# Patient Record
Sex: Female | Born: 1990 | Race: Black or African American | Hispanic: No | Marital: Single | Smoking: Never smoker
Health system: Southern US, Community
[De-identification: ages and names within clinical notes are randomized; demographics above are authoritative.]

## PROBLEM LIST (undated history)

## (undated) HISTORY — PX: NO PAST SURGERIES: SHX2092

---

## 2016-12-23 ENCOUNTER — Encounter: Payer: Self-pay | Admitting: Certified Nurse Midwife

## 2016-12-30 ENCOUNTER — Encounter: Payer: Self-pay | Admitting: Certified Nurse Midwife

## 2016-12-30 ENCOUNTER — Other Ambulatory Visit: Payer: Self-pay | Admitting: Certified Nurse Midwife

## 2016-12-30 ENCOUNTER — Ambulatory Visit (INDEPENDENT_AMBULATORY_CARE_PROVIDER_SITE_OTHER): Payer: BC Managed Care – PPO | Admitting: Certified Nurse Midwife

## 2016-12-30 VITALS — BP 119/77 | HR 84 | Ht 65.0 in | Wt 169.6 lb

## 2016-12-30 DIAGNOSIS — Z3009 Encounter for other general counseling and advice on contraception: Secondary | ICD-10-CM | POA: Diagnosis not present

## 2016-12-30 DIAGNOSIS — Z113 Encounter for screening for infections with a predominantly sexual mode of transmission: Secondary | ICD-10-CM

## 2016-12-30 DIAGNOSIS — Z6828 Body mass index (BMI) 28.0-28.9, adult: Secondary | ICD-10-CM | POA: Diagnosis not present

## 2016-12-30 DIAGNOSIS — Z01419 Encounter for gynecological examination (general) (routine) without abnormal findings: Secondary | ICD-10-CM | POA: Diagnosis not present

## 2016-12-30 MED ORDER — MISOPROSTOL 200 MCG PO TABS
ORAL_TABLET | ORAL | 2 refills | Status: DC
Start: 2016-12-30 — End: 2017-03-14

## 2016-12-30 NOTE — Progress Notes (Signed)
ANNUAL PREVENTATIVE CARE GYN  ENCOUNTER NOTE  Subjective:       Briana Shepherd is a 26 y.o. G0P0000 female here for a routine annual gynecologic exam.  She would like to discuss other contraception options bedside "the pill".   Denies difficulty breathing or respiratory distress, chest pain, abdominal pain, excessive vaginal bleeding, dysuria, and leg pain or swelling.   Graduated recently from [employer]. Works as Animal nutritionist.    Gynecologic History  Patient's last menstrual period was 11/04/2016 (exact date). Changed pill three (3) months ago.  Contraception: oral progesterone-only contraceptive  Last Pap: 09/2015. Results were: normal  Obstetric History  OB History  Gravida Para Term Preterm AB Living  0 0 0 0 0 0  SAB TAB Ectopic Multiple Live Births  0 0 0 0 0        Current Outpatient Medications on File Prior to Visit  Medication Sig Dispense Refill  . norethindrone-ethinyl estradiol (MICROGESTIN,JUNEL,LOESTRIN) 1-20 MG-MCG tablet Take 1 tablet by mouth daily.     No current facility-administered medications on file prior to visit.     Allergies not on file  Social History   Socioeconomic History  . Marital status: Single    Spouse name: Not on file  . Number of children: Not on file  . Years of education: Not on file  . Highest education level: Not on file  Social Needs  . Financial resource strain: Not on file  . Food insecurity - worry: Not on file  . Food insecurity - inability: Not on file  . Transportation needs - medical: Not on file  . Transportation needs - non-medical: Not on file  Occupational History  . Not on file  Tobacco Use  . Smoking status: Never Smoker  . Smokeless tobacco: Never Used  Substance and Sexual Activity  . Alcohol use: Yes    Comment: occas  . Drug use: No  . Sexual activity: Yes    Birth control/protection: Pill  Other Topics Concern  . Not on file  Social History Narrative  . Not on file    Family History   Problem Relation Age of Onset  . Diabetes Maternal Grandmother   . Stroke Maternal Grandmother     The following portions of the patient's history were reviewed and updated as appropriate: allergies, current medications, past family history, past medical history, past social history, past surgical history and problem list.  Review of Systems  ROS negative except as noted above. Information obtained from patient.    Objective:   BP 119/77   Pulse 84   Ht 5\' 5"  (1.651 m)   Wt 169 lb 9 oz (76.9 kg)   LMP 11/04/2016 (Exact Date)   BMI 28.22 kg/m   CONSTITUTIONAL: Well-developed, well-nourished female in no acute distress.   PSYCHIATRIC: Normal mood and affect. Normal behavior. Normal judgment and thought content.  [place]: Alert and oriented to person, place, and time. Normal muscle tone coordination. No cranial nerve deficit noted.  HENT:  Normocephalic, atraumatic, External right and left ear normal. Oropharynx is clear and moist  EYES: Conjunctivae and EOM are normal. Pupils are equal, round, and reactive to light. No scleral icterus.   NECK: Normal range of motion, supple, no masses.  Normal thyroid.   SKIN: Skin is warm and dry. No rash noted. Not diaphoretic. No erythema. No pallor.  CARDIOVASCULAR: Normal heart rate noted, regular rhythm, no murmur.  RESPIRATORY: Clear to auscultation bilaterally. Effort and breath sounds normal, no problems with respiration  noted.  BREASTS: Symmetric in size. No masses, skin changes, nipple drainage, or lymphadenopathy.  ABDOMEN: Soft, normal bowel sounds, no distention noted.  No tenderness, rebound or guarding.   PELVIC:  External Genitalia: Normal  Vagina: Normal  Cervix: Normal  Uterus: Normal  Adnexa: Normal   MUSCULOSKELETAL: Normal range of motion. No tenderness.  No cyanosis, clubbing, or edema.  2+ distal pulses.  LYMPHATIC: No Axillary, Supraclavicular, or Inguinal Adenopathy.  UPT negative.  Assessment:    Annual gynecologic examination 26 y.o.   Contraception: OCP (estrogen/progesterone); desires IUD placement  Overweight   Problem List Items Addressed This Visit    None    Visit Diagnoses    Well woman exam with routine gynecological exam    -  Primary   Relevant Orders   POCT urine pregnancy   CBC   Comprehensive metabolic panel   Lipid panel   BMI 28.0-28.9,adult       Relevant Orders   Lipid panel   Routine screening for STI (sexually transmitted infection)       Encounter for counseling regarding contraception       Relevant Orders   POCT urine pregnancy      Plan:   Pap: Pap, Reflex if ASCUS and GC/CT NAAT  Labs: CBC, CMP, and Lipid Panel; see orders.   Routine preventative health maintenance measures emphasized: Exercise/Diet/Weight control, Tobacco Warnings, Alcohol/Substance use risks, Stress Management, Peer Pressure Issues and Safe Sex  Reviewed all forms of birth control options available including abstinence; over the counter/barrier methods; hormonal contraceptive medication including pill, patch, ring, injection,contraceptive implant; hormonal and nonhormonal IUDs. Risks and benefits reviewed.  Questions were answered.  Information was given to patient to review.   RTC x 1 year for annual exam or sooner if needed.   RTC x 1-2 weeks for IUD placement, if desired. Rx: Cytotec, see orders.   Diona Fanti, CNM [facility], [facility]

## 2016-12-30 NOTE — Progress Notes (Signed)
Pt is here for a pap smear. No history of abnormals. Pt switched OCP brand 3 months ago and this is the first time she has missed a period.

## 2016-12-30 NOTE — Patient Instructions (Signed)
Ethinyl Estradiol; Etonogestrel vaginal ring What is this medicine? ETHINYL ESTRADIOL; ETONOGESTREL (ETH in il es tra DYE ole; et oh noe JES trel) vaginal ring is a flexible, vaginal ring used as a contraceptive (birth control method). This medicine combines two types of female hormones, an estrogen and a progestin. This ring is used to prevent ovulation and pregnancy. Each ring is effective for one month. This medicine may be used for other purposes; ask your health care provider or pharmacist if you have questions. COMMON BRAND NAME(S): NuvaRing What should I tell my health care provider before I take this medicine? They need to know if you have or ever had any of these conditions: -abnormal vaginal bleeding -blood vessel disease or blood clots -breast, cervical, endometrial, ovarian, liver, or uterine cancer -diabetes -gallbladder disease -heart disease or recent heart attack -high blood pressure -high cholesterol -kidney disease -liver disease -migraine headaches -stroke -systemic lupus erythematosus (SLE) -tobacco smoker -an unusual or allergic reaction to estrogens, progestins, other medicines, foods, dyes, or preservatives -pregnant or trying to get pregnant -breast-feeding How should I use this medicine? Insert the ring into your vagina as directed. Follow the directions on the prescription label. The ring will remain place for 3 weeks and is then removed for a 1-week break. A new ring is inserted 1 week after the last ring was removed, on the same day of the week. Check often to make sure the ring is still in place, especially before and after sexual intercourse. If the ring was out of the vagina for an unknown amount of time, you may not be protected from pregnancy. Perform a pregnancy test and call your doctor. Do not use more often than directed. A patient package insert for the product will be given with each prescription and refill. Read this sheet carefully each time. The  sheet may change frequently. Contact your pediatrician regarding the use of this medicine in children. Special care may be needed. This medicine has been used in female children who have started having menstrual periods. Overdosage: If you think you have taken too much of this medicine contact a poison control center or emergency room at once. NOTE: This medicine is only for you. Do not share this medicine with others. What if I miss a dose? You will need to replace your vaginal ring once a month as directed. If the ring should slip out, or if you leave it in longer or shorter than you should, contact your health care professional for advice. What may interact with this medicine? Do not take this medicine with the following medication: -dasabuvir; ombitasvir; paritaprevir; ritonavir -ombitasvir; paritaprevir; ritonavir This medicine may also interact with the following medications: -acetaminophen -antibiotics or medicines for infections, especially rifampin, rifabutin, rifapentine, and griseofulvin, and possibly penicillins or tetracyclines -aprepitant -ascorbic acid (vitamin C) -atorvastatin -barbiturate medicines, such as phenobarbital -bosentan -carbamazepine -caffeine -clofibrate -cyclosporine -dantrolene -doxercalciferol -felbamate -grapefruit juice -hydrocortisone -medicines for anxiety or sleeping problems, such as diazepam or temazepam -medicines for diabetes, including pioglitazone -modafinil -mycophenolate -nefazodone -oxcarbazepine -phenytoin -prednisolone -ritonavir or other medicines for HIV infection or AIDS -rosuvastatin -selegiline -soy isoflavones supplements -St. John's wort -tamoxifen or raloxifene -theophylline -thyroid hormones -topiramate -warfarin This list may not describe all possible interactions. Give your health care provider a list of all the medicines, herbs, non-prescription drugs, or dietary supplements you use. Also tell them if you smoke,  drink alcohol, or use illegal drugs. Some items may interact with your medicine. What should I watch for while using   this medicine? Visit your doctor or health care professional for regular checks on your progress. You will need a regular breast and pelvic exam and Pap smear while on this medicine. Use an additional method of contraception during the first cycle that you use this ring. Do not use a diaphragm or female condom, as the ring can interfere with these birth control methods and their proper placement. If you have any reason to think you are pregnant, stop using this medicine right away and contact your doctor or health care professional. If you are using this medicine for hormone related problems, it may take several cycles of use to see improvement in your condition. Smoking increases the risk of getting a blood clot or having a stroke while you are using hormonal birth control, especially if you are more than 27 years old. You are strongly advised not to smoke. This medicine can make your body retain fluid, making your fingers, hands, or ankles swell. Your blood pressure can go up. Contact your doctor or health care professional if you feel you are retaining fluid. This medicine can make you more sensitive to the sun. Keep out of the sun. If you cannot avoid being in the sun, wear protective clothing and use sunscreen. Do not use sun lamps or tanning beds/booths. If you wear contact lenses and notice visual changes, or if the lenses begin to feel uncomfortable, consult your eye care specialist. In some women, tenderness, swelling, or minor bleeding of the gums may occur. Notify your dentist if this happens. Brushing and flossing your teeth regularly may help limit this. See your dentist regularly and inform your dentist of the medicines you are taking. If you are going to have elective surgery, you may need to stop using this medicine before the surgery. Consult your health care professional  for advice. This medicine does not protect you against HIV infection (AIDS) or any other sexually transmitted diseases. What side effects may I notice from receiving this medicine? Side effects that you should report to your doctor or health care professional as soon as possible: -breast tissue changes or discharge -changes in vaginal bleeding during your period or between your periods -chest pain -coughing up blood -dizziness or fainting spells -headaches or migraines -leg, arm or groin pain -severe or sudden headaches -stomach pain (severe) -sudden shortness of breath -sudden loss of coordination, especially on one side of the body -speech problems -symptoms of vaginal infection like itching, irritation or unusual discharge -tenderness in the upper abdomen -vomiting -weakness or numbness in the arms or legs, especially on one side of the body -yellowing of the eyes or skin Side effects that usually do not require medical attention (report to your doctor or health care professional if they continue or are bothersome): -breakthrough bleeding and spotting that continues beyond the 3 initial cycles of pills -breast tenderness -mood changes, anxiety, depression, frustration, anger, or emotional outbursts -increased sensitivity to sun or ultraviolet light -nausea -skin rash, acne, or brown spots on the skin -weight gain (slight) This list may not describe all possible side effects. Call your doctor for medical advice about side effects. You may report side effects to FDA at 1-800-FDA-1088. Where should I keep my medicine? Keep out of the reach of children. Store at room temperature between 15 and 30 degrees C (59 and 86 degrees F) for up to 4 months. The product will expire after 4 months. Protect from light. Throw away any unused medicine after the expiration date. NOTE: This   sheet is a summary. It may not cover all possible information. If you have questions about this medicine, talk  to your doctor, pharmacist, or health care provider.  2018 Elsevier/Gold Standard (2015-09-08 17:00:31) Levonorgestrel intrauterine device (IUD) What is this medicine? LEVONORGESTREL IUD (LEE voe nor jes trel) is a contraceptive (birth control) device. The device is placed inside the uterus by a healthcare professional. It is used to prevent pregnancy. This device can also be used to treat heavy bleeding that occurs during your period. This medicine may be used for other purposes; ask your health care provider or pharmacist if you have questions. COMMON BRAND NAME(S): Minette [place] What should I tell my health care provider before I take this medicine? They need to know if you have any of these conditions: -abnormal Pap smear -cancer of the breast, uterus, or cervix -diabetes -endometritis -genital or pelvic infection now or in the past -have more than one sexual partner or your partner has more than one partner -heart disease -history of an ectopic or tubal pregnancy -immune system problems -IUD in place -liver disease or tumor -problems with blood clots or take blood-thinners -seizures -use intravenous drugs -uterus of unusual shape -vaginal bleeding that has not been explained -an unusual or allergic reaction to levonorgestrel, other hormones, silicone, or polyethylene, medicines, foods, dyes, or preservatives -pregnant or trying to get pregnant -breast-feeding How should I use this medicine? This device is placed inside the uterus by a health care professional. Talk to your pediatrician regarding the use of this medicine in children. Special care may be needed. Overdosage: If you think you have taken too much of this medicine contact a poison control center or emergency room at once. NOTE: This medicine is only for you. Do not share this medicine with others. What if I miss a dose? This does not apply. Depending on the brand of device you have inserted, the  device will need to be replaced every 3 to 5 years if you wish to continue using this type of birth control. What may interact with this medicine? Do not take this medicine with any of the following medications: -amprenavir -bosentan -fosamprenavir This medicine may also interact with the following medications: -aprepitant -armodafinil -barbiturate medicines for inducing sleep or treating seizures -bexarotene -boceprevir -griseofulvin -medicines to treat seizures like carbamazepine, ethotoin, felbamate, oxcarbazepine, phenytoin, topiramate -modafinil -pioglitazone -rifabutin -rifampin -rifapentine -some medicines to treat HIV infection like atazanavir, efavirenz, indinavir, lopinavir, nelfinavir, tipranavir, ritonavir -St. John's wort -warfarin This list may not describe all possible interactions. Give your health care provider a list of all the medicines, herbs, non-prescription drugs, or dietary supplements you use. Also tell them if you smoke, drink alcohol, or use illegal drugs. Some items may interact with your medicine. What should I watch for while using this medicine? Visit your doctor or health care professional for regular check ups. See your doctor if you or your partner has sexual contact with others, becomes HIV positive, or gets a sexual transmitted disease. This product does not protect you against HIV infection (AIDS) or other sexually transmitted diseases. You can check the placement of the IUD yourself by reaching up to the top of your vagina with clean fingers to feel the threads. Do not pull on the threads. It is a good habit to check placement after each menstrual period. Call your doctor right away if you feel more of the IUD than just the threads or if you cannot feel the threads at all. The IUD  may come out by itself. You may become pregnant if the device comes out. If you notice that the IUD has come out use a backup birth control method like condoms and call your  health care provider. Using tampons will not change the position of the IUD and are okay to use during your period. This IUD can be safely scanned with magnetic resonance imaging (MRI) only under specific conditions. Before you have an MRI, tell your healthcare provider that you have an IUD in place, and which type of IUD you have in place. What side effects may I notice from receiving this medicine? Side effects that you should report to your doctor or health care professional as soon as possible: -allergic reactions like skin rash, itching or hives, swelling of the face, lips, or tongue -fever, flu-like symptoms -genital sores -high blood pressure -no menstrual period for 6 weeks during use -pain, swelling, warmth in the leg -pelvic pain or tenderness -severe or sudden headache -signs of pregnancy -stomach cramping -sudden shortness of breath -trouble with balance, talking, or walking -unusual vaginal bleeding, discharge -yellowing of the eyes or skin Side effects that usually do not require medical attention (report to your doctor or health care professional if they continue or are bothersome): -acne -breast pain -change in sex drive or performance -changes in weight -cramping, dizziness, or faintness while the device is being inserted -headache -irregular menstrual bleeding within first 3 to 6 months of use -nausea This list may not describe all possible side effects. Call your doctor for medical advice about side effects. You may report side effects to FDA at 1-800-FDA-1088. Where should I keep my medicine? This does not apply. NOTE: This sheet is a summary. It may not cover all possible information. If you have questions about this medicine, talk to your doctor, pharmacist, or health care provider.  2018 Elsevier/Gold Standard (2015-10-13 14:14:56) Preventive Care 18-39 Years, Female Preventive care refers to lifestyle choices and visits with your health care provider that  can promote health and wellness. What does preventive care include?  A yearly physical exam. This is also called an annual well check.  Dental exams once or twice a year.  Routine eye exams. Ask your health care provider how often you should have your eyes checked.  Personal lifestyle choices, including: ? Daily care of your teeth and gums. ? Regular physical activity. ? Eating a healthy diet. ? Avoiding tobacco and drug use. ? Limiting alcohol use. ? Practicing safe sex. ? Taking vitamin and mineral supplements as recommended by your health care provider. What happens during an annual well check? The services and screenings done by your health care provider during your annual well check will depend on your age, overall health, lifestyle risk factors, and family history of disease. Counseling Your health care provider may ask you questions about your:  Alcohol use.  Tobacco use.  Drug use.  Emotional well-being.  Home and relationship well-being.  Sexual activity.  Eating habits.  Work and work Statistician.  Method of birth control.  Menstrual cycle.  Pregnancy history.  Screening You may have the following tests or measurements:  Height, weight, and BMI.  Diabetes screening. This is done by checking your blood sugar (glucose) after you have not eaten for a while (fasting).  Blood pressure.  Lipid and cholesterol levels. These may be checked every 5 years starting at age 79.  Skin check.  Hepatitis C blood test.  Hepatitis B blood test.  Sexually transmitted disease (STD)  testing.  BRCA-related cancer screening. This may be done if you have a family history of breast, ovarian, tubal, or peritoneal cancers.  Pelvic exam and Pap test. This may be done every 3 years starting at age 50. Starting at age 81, this may be done every 5 years if you have a Pap test in combination with an HPV test.  Discuss your test results, treatment options, and if  necessary, the need for more tests with your health care provider. Vaccines Your health care provider may recommend certain vaccines, such as:  Influenza vaccine. This is recommended every year.  Tetanus, diphtheria, and acellular pertussis (Tdap, Td) vaccine. You may need a Td booster every 10 years.  Varicella vaccine. You may need this if you have not been vaccinated.  HPV vaccine. If you are 86 or younger, you may need three doses over 6 months.  Measles, mumps, and rubella (MMR) vaccine. You may need at least one dose of MMR. You may also need a second dose.  Pneumococcal 13-valent conjugate (PCV13) vaccine. You may need this if you have certain conditions and were not previously vaccinated.  Pneumococcal polysaccharide (PPSV23) vaccine. You may need one or two doses if you smoke cigarettes or if you have certain conditions.  Meningococcal vaccine. One dose is recommended if you are age 16-21 years and a first-year college student living in a residence hall, or if you have one of several medical conditions. You may also need additional booster doses.  Hepatitis A vaccine. You may need this if you have certain conditions or if you travel or work in places where you may be exposed to hepatitis A.  Hepatitis B vaccine. You may need this if you have certain conditions or if you travel or work in places where you may be exposed to hepatitis B.  Haemophilus influenzae type b (Hib) vaccine. You may need this if you have certain risk factors.  Talk to your health care provider about which screenings and vaccines you need and how often you need them. This information is not intended to replace advice given to you by your health care provider. Make sure you discuss any questions you have with your health care provider. Document Released: 02/26/2001 Document Revised: 09/20/2015 Document Reviewed: 11/01/2014 Elsevier Interactive Patient Education  2017 [employer].

## 2017-01-03 LAB — PAP LB, RFX HPV ALL PTH: PAP SMEAR COMMENT: 0

## 2017-01-10 ENCOUNTER — Other Ambulatory Visit: Payer: BC Managed Care – PPO

## 2017-01-10 DIAGNOSIS — Z01419 Encounter for gynecological examination (general) (routine) without abnormal findings: Secondary | ICD-10-CM

## 2017-01-10 DIAGNOSIS — Z6828 Body mass index (BMI) 28.0-28.9, adult: Secondary | ICD-10-CM

## 2017-01-11 LAB — CBC
Hematocrit: 40.4 % (ref 34.0–46.6)
Hemoglobin: 12.7 g/dL (ref 11.1–15.9)
MCH: 26.6 pg (ref 26.6–33.0)
MCHC: 31.4 g/dL — AB (ref 31.5–35.7)
MCV: 85 fL (ref 79–97)
PLATELETS: 331 10*3/uL (ref 150–379)
RBC: 4.78 x10E6/uL (ref 3.77–5.28)
RDW: 12.8 % (ref 12.3–15.4)
WBC: 4.5 10*3/uL (ref 3.4–10.8)

## 2017-01-11 LAB — LIPID PANEL
CHOLESTEROL TOTAL: 198 mg/dL (ref 100–199)
Chol/HDL Ratio: 3.4 ratio (ref 0.0–4.4)
HDL: 59 mg/dL (ref >39)
LDL Calculated: 119 mg/dL — ABNORMAL HIGH (ref 0–99)
Triglycerides: 98 mg/dL (ref 0–149)
VLDL CHOLESTEROL CAL: 20 mg/dL (ref 5–40)

## 2017-01-11 LAB — COMPREHENSIVE METABOLIC PANEL
ALBUMIN: 3.9 g/dL (ref 3.5–5.5)
ALK PHOS: 72 IU/L (ref 39–117)
ALT: 13 IU/L (ref 0–32)
AST: 18 IU/L (ref 0–40)
Albumin/Globulin Ratio: 1.1 — ABNORMAL LOW (ref 1.2–2.2)
BUN / CREAT RATIO: 12 (ref 9–23)
BUN: 11 mg/dL (ref 6–20)
Bilirubin Total: 0.4 mg/dL (ref 0.0–1.2)
CO2: 23 mmol/L (ref 20–29)
CREATININE: 0.91 mg/dL (ref 0.57–1.00)
Calcium: 9.5 mg/dL (ref 8.7–10.2)
Chloride: 103 mmol/L (ref 96–106)
GFR, EST AFRICAN AMERICAN: 100 mL/min/{1.73_m2} (ref >59)
GFR, EST NON AFRICAN AMERICAN: 87 mL/min/{1.73_m2} (ref >59)
GLOBULIN, TOTAL: 3.7 g/dL (ref 1.5–4.5)
Glucose: 91 mg/dL (ref 65–99)
Potassium: 4.5 mmol/L (ref 3.5–5.2)
SODIUM: 140 mmol/L (ref 134–144)
Total Protein: 7.6 g/dL (ref 6.0–8.5)

## 2017-01-31 ENCOUNTER — Encounter: Payer: Self-pay | Admitting: Certified Nurse Midwife

## 2017-01-31 ENCOUNTER — Ambulatory Visit (INDEPENDENT_AMBULATORY_CARE_PROVIDER_SITE_OTHER): Payer: BC Managed Care – PPO | Admitting: Certified Nurse Midwife

## 2017-01-31 VITALS — BP 126/68 | HR 84 | Ht 65.0 in | Wt 169.9 lb

## 2017-01-31 DIAGNOSIS — Z30430 Encounter for insertion of intrauterine contraceptive device: Secondary | ICD-10-CM

## 2017-01-31 LAB — POCT URINE PREGNANCY: Preg Test, Ur: NEGATIVE

## 2017-01-31 MED ORDER — LEVONORGESTREL 13.5 MG IU IUD: INTRAUTERINE_SYSTEM | Freq: Once | INTRAUTERINE | Status: DC

## 2017-01-31 NOTE — Patient Instructions (Signed)
IUD PLACEMENT POST-PROCEDURE INSTRUCTIONS  1. You may take Ibuprofen, Aleve or Tylenol for pain if needed.  Cramping should resolve within in 24 hours.  2. You may have a small amount of spotting.  You should wear a mini pad for the next few days.  3. You may have intercourse after 72 hours.  If you using this for birth control, it is effective immediately.  4. You need to call if you have any pelvic pain, fever, heavy bleeding or foul smelling vaginal discharge.  Irregular bleeding is common the first several months after having an IUD placed. You do not need to call for this reason unless you are concerned.  5. Shower or bathe as normal  You should have a follow-up appointment in 4-8 weeks for a re-check to make sure you are not having any problems.  Levonorgestrel intrauterine device (IUD) What is this medicine? LEVONORGESTREL IUD (LEE voe nor jes trel) is a contraceptive (birth control) device. The device is placed inside the uterus by a healthcare professional. It is used to prevent pregnancy. This device can also be used to treat heavy bleeding that occurs during your period. This medicine may be used for other purposes; ask your health care provider or pharmacist if you have questions. COMMON BRAND NAME(S): Minette Headland What should I tell my health care provider before I take this medicine? They need to know if you have any of these conditions: -abnormal Pap smear -cancer of the breast, uterus, or cervix -diabetes -endometritis -genital or pelvic infection now or in the past -have more than one sexual partner or your partner has more than one partner -heart disease -history of an ectopic or tubal pregnancy -immune system problems -IUD in place -liver disease or tumor -problems with blood clots or take blood-thinners -seizures -use intravenous drugs -uterus of unusual shape -vaginal bleeding that has not been explained -an unusual or allergic reaction to  levonorgestrel, other hormones, silicone, or polyethylene, medicines, foods, dyes, or preservatives -pregnant or trying to get pregnant -breast-feeding How should I use this medicine? This device is placed inside the uterus by a health care professional. Talk to your pediatrician regarding the use of this medicine in children. Special care may be needed. Overdosage: If you think you have taken too much of this medicine contact a poison control center or emergency room at once. NOTE: This medicine is only for you. Do not share this medicine with others. What if I miss a dose? This does not apply. Depending on the brand of device you have inserted, the device will need to be replaced every 3 to 5 years if you wish to continue using this type of birth control. What may interact with this medicine? Do not take this medicine with any of the following medications: -amprenavir -bosentan -fosamprenavir This medicine may also interact with the following medications: -aprepitant -armodafinil -barbiturate medicines for inducing sleep or treating seizures -bexarotene -boceprevir -griseofulvin -medicines to treat seizures like carbamazepine, ethotoin, felbamate, oxcarbazepine, phenytoin, topiramate -modafinil -pioglitazone -rifabutin -rifampin -rifapentine -some medicines to treat HIV infection like atazanavir, efavirenz, indinavir, lopinavir, nelfinavir, tipranavir, ritonavir -St. John's wort -warfarin This list may not describe all possible interactions. Give your health care provider a list of all the medicines, herbs, non-prescription drugs, or dietary supplements you use. Also tell them if you smoke, drink alcohol, or use illegal drugs. Some items may interact with your medicine. What should I watch for while using this medicine? Visit your doctor or health care professional  check ups. See your doctor if you or your partner has sexual contact with others, becomes HIV positive, or  gets a sexual transmitted disease. This product does not protect you against HIV infection (AIDS) or other sexually transmitted diseases. You can check the placement of the IUD yourself by reaching up to the top of your vagina with clean fingers to feel the threads. Do not pull on the threads. It is a good habit to check placement after each menstrual period. Call your doctor right away if you feel more of the IUD than just the threads or if you cannot feel the threads at all. The IUD may come out by itself. You may become pregnant if the device comes out. If you notice that the IUD has come out use a backup birth control method like condoms and call your health care provider. Using tampons will not change the position of the IUD and are okay to use during your period. This IUD can be safely scanned with magnetic resonance imaging (MRI) only under specific conditions. Before you have an MRI, tell your healthcare provider that you have an IUD in place, and which type of IUD you have in place. What side effects may I notice from receiving this medicine? Side effects that you should report to your doctor or health care professional as soon as possible: -allergic reactions like skin rash, itching or hives, swelling of the face, lips, or tongue -fever, flu-like symptoms -genital sores -high blood pressure -no menstrual period for 6 weeks during use -pain, swelling, warmth in the leg -pelvic pain or tenderness -severe or sudden headache -signs of pregnancy -stomach cramping -sudden shortness of breath -trouble with balance, talking, or walking -unusual vaginal bleeding, discharge -yellowing of the eyes or skin Side effects that usually do not require medical attention (report to your doctor or health care professional if they continue or are bothersome): -acne -breast pain -change in sex drive or performance -changes in weight -cramping, dizziness, or faintness while the device is being  inserted -headache -irregular menstrual bleeding within first 3 to 6 months of use -nausea This list may not describe all possible side effects. Call your doctor for medical advice about side effects. You may report side effects to FDA at 1-800-FDA-1088. Where should I keep my medicine? This does not apply. NOTE: This sheet is a summary. It may not cover all possible information. If you have questions about this medicine, talk to your doctor, pharmacist, or health care provider.  2018 Elsevier/Gold Standard (2015-10-13 14:14:56)  

## 2017-02-01 NOTE — Progress Notes (Signed)
Briana Shepherd is a 27 y.o. year old [place] female who presents for placement of a Skyla IUD.  Patient's last menstrual period was 01/28/2017. BP 126/68 (BP Location: Left Arm, Patient Position: Sitting, Cuff Size: Normal)   Pulse 84   Ht 5\' 5"  (1.651 m)   Wt 169 lb 14.4 oz (77.1 kg)   LMP 01/28/2017   BMI 28.27 kg/m    She is not sexually active at this time and pregnancy test today was negative.  The risks and benefits of the method and placement have been thouroughly reviewed with the patient and all questions were answered.  Specifically the patient is aware of failure rate of 01/998, expulsion of the IUD and of possible perforation.  The patient is aware of irregular bleeding due to the method and understands the incidence of irregular bleeding diminishes with time.  Signed copy of informed consent in chart.   Time out was performed.  A small plastic speculum was placed in the vagina.  The cervix was visualized, prepped using Betadine, and grasped with a single tooth tenaculum. The uterus was found to be retroflexed and it sounded to 7 cm.  Skyla IUD placed per manufacturer's recommendations.   The strings were trimmed to 3 cm.  The patient was given post procedure instructions, including signs and symptoms of infection and to check for the strings after each menses or each month, and refraining from intercourse or anything in the vagina for 3 days.  She was given a [name] care card with date Skyla placed, and date Skyla to be removed.  Reviewed red flag symptoms and when to call.   RTC x 4-6 weeks for IUD string check or sooner if needed.   Diona Fanti, CNM [facility], [facility]

## 2017-03-14 ENCOUNTER — Ambulatory Visit (INDEPENDENT_AMBULATORY_CARE_PROVIDER_SITE_OTHER): Payer: BC Managed Care – PPO | Admitting: Certified Nurse Midwife

## 2017-03-14 ENCOUNTER — Encounter: Payer: Self-pay | Admitting: Certified Nurse Midwife

## 2017-03-14 VITALS — BP 110/73 | HR 85 | Ht 65.0 in | Wt 171.2 lb

## 2017-03-14 DIAGNOSIS — Z975 Presence of (intrauterine) contraceptive device: Secondary | ICD-10-CM

## 2017-03-14 DIAGNOSIS — Z30431 Encounter for routine checking of intrauterine contraceptive device: Secondary | ICD-10-CM

## 2017-03-14 NOTE — Patient Instructions (Signed)
Preventive Care 18-39 Years, Female Preventive care refers to lifestyle choices and visits with your health care provider that can promote health and wellness. What does preventive care include?  A yearly physical exam. This is also called an annual well check.  Dental exams once or twice a year.  Routine eye exams. Ask your health care provider how often you should have your eyes checked.  Personal lifestyle choices, including: ? Daily care of your teeth and gums. ? Regular physical activity. ? Eating a healthy diet. ? Avoiding tobacco and drug use. ? Limiting alcohol use. ? Practicing safe sex. ? Taking vitamin and mineral supplements as recommended by your health care provider. What happens during an annual well check? The services and screenings done by your health care provider during your annual well check will depend on your age, overall health, lifestyle risk factors, and family history of disease. Counseling Your health care provider may ask you questions about your:  Alcohol use.  Tobacco use.  Drug use.  Emotional well-being.  Home and relationship well-being.  Sexual activity.  Eating habits.  Work and work Statistician.  Method of birth control.  Menstrual cycle.  Pregnancy history.  Screening You may have the following tests or measurements:  Height, weight, and BMI.  Diabetes screening. This is done by checking your blood sugar (glucose) after you have not eaten for a while (fasting).  Blood pressure.  Lipid and cholesterol levels. These may be checked every 5 years starting at age 66.  Skin check.  Hepatitis C blood test.  Hepatitis B blood test.  Sexually transmitted disease (STD) testing.  BRCA-related cancer screening. This may be done if you have a family history of breast, ovarian, tubal, or peritoneal cancers.  Pelvic exam and Pap test. This may be done every 3 years starting at age 40. Starting at age 59, this may be done every 5  years if you have a Pap test in combination with an HPV test.  Discuss your test results, treatment options, and if necessary, the need for more tests with your health care provider. Vaccines Your health care provider may recommend certain vaccines, such as:  Influenza vaccine. This is recommended every year.  Tetanus, diphtheria, and acellular pertussis (Tdap, Td) vaccine. You may need a Td booster every 10 years.  Varicella vaccine. You may need this if you have not been vaccinated.  HPV vaccine. If you are 69 or younger, you may need three doses over 6 months.  Measles, mumps, and rubella (MMR) vaccine. You may need at least one dose of MMR. You may also need a second dose.  Pneumococcal 13-valent conjugate (PCV13) vaccine. You may need this if you have certain conditions and were not previously vaccinated.  Pneumococcal polysaccharide (PPSV23) vaccine. You may need one or two doses if you smoke cigarettes or if you have certain conditions.  Meningococcal vaccine. One dose is recommended if you are age 27-21 years and a first-year college student living in a residence hall, or if you have one of several medical conditions. You may also need additional booster doses.  Hepatitis A vaccine. You may need this if you have certain conditions or if you travel or work in places where you may be exposed to hepatitis A.  Hepatitis B vaccine. You may need this if you have certain conditions or if you travel or work in places where you may be exposed to hepatitis B.  Haemophilus influenzae type b (Hib) vaccine. You may need this if  you have certain risk factors.  Talk to your health care provider about which screenings and vaccines you need and how often you need them. This information is not intended to replace advice given to you by your health care provider. Make sure you discuss any questions you have with your health care provider. Document Released: 02/26/2001 Document Revised: 09/20/2015  Document Reviewed: 11/01/2014 Elsevier Interactive Patient Education  2018 Elsevier Inc. Levonorgestrel intrauterine device (IUD) What is this medicine? LEVONORGESTREL IUD (LEE voe nor jes trel) is a contraceptive (birth control) device. The device is placed inside the uterus by a healthcare professional. It is used to prevent pregnancy. This device can also be used to treat heavy bleeding that occurs during your period. This medicine may be used for other purposes; ask your health care provider or pharmacist if you have questions. COMMON BRAND NAME(S): Kyleena, LILETTA, Mirena, Skyla What should I tell my health care provider before I take this medicine? They need to know if you have any of these conditions: -abnormal Pap smear -cancer of the breast, uterus, or cervix -diabetes -endometritis -genital or pelvic infection now or in the past -have more than one sexual partner or your partner has more than one partner -heart disease -history of an ectopic or tubal pregnancy -immune system problems -IUD in place -liver disease or tumor -problems with blood clots or take blood-thinners -seizures -use intravenous drugs -uterus of unusual shape -vaginal bleeding that has not been explained -an unusual or allergic reaction to levonorgestrel, other hormones, silicone, or polyethylene, medicines, foods, dyes, or preservatives -pregnant or trying to get pregnant -breast-feeding How should I use this medicine? This device is placed inside the uterus by a health care professional. Talk to your pediatrician regarding the use of this medicine in children. Special care may be needed. Overdosage: If you think you have taken too much of this medicine contact a poison control center or emergency room at once. NOTE: This medicine is only for you. Do not share this medicine with others. What if I miss a dose? This does not apply. Depending on the brand of device you have inserted, the device will need  to be replaced every 3 to 5 years if you wish to continue using this type of birth control. What may interact with this medicine? Do not take this medicine with any of the following medications: -amprenavir -bosentan -fosamprenavir This medicine may also interact with the following medications: -aprepitant -armodafinil -barbiturate medicines for inducing sleep or treating seizures -bexarotene -boceprevir -griseofulvin -medicines to treat seizures like carbamazepine, ethotoin, felbamate, oxcarbazepine, phenytoin, topiramate -modafinil -pioglitazone -rifabutin -rifampin -rifapentine -some medicines to treat HIV infection like atazanavir, efavirenz, indinavir, lopinavir, nelfinavir, tipranavir, ritonavir -St. John's wort -warfarin This list may not describe all possible interactions. Give your health care provider a list of all the medicines, herbs, non-prescription drugs, or dietary supplements you use. Also tell them if you smoke, drink alcohol, or use illegal drugs. Some items may interact with your medicine. What should I watch for while using this medicine? Visit your doctor or health care professional for regular check ups. See your doctor if you or your partner has sexual contact with others, becomes HIV positive, or gets a sexual transmitted disease. This product does not protect you against HIV infection (AIDS) or other sexually transmitted diseases. You can check the placement of the IUD yourself by reaching up to the top of your vagina with clean fingers to feel the threads. Do not pull on the threads. It   is a good habit to check placement after each menstrual period. Call your doctor right away if you feel more of the IUD than just the threads or if you cannot feel the threads at all. The IUD may come out by itself. You may become pregnant if the device comes out. If you notice that the IUD has come out use a backup birth control method like condoms and call your health care  provider. Using tampons will not change the position of the IUD and are okay to use during your period. This IUD can be safely scanned with magnetic resonance imaging (MRI) only under specific conditions. Before you have an MRI, tell your healthcare provider that you have an IUD in place, and which type of IUD you have in place. What side effects may I notice from receiving this medicine? Side effects that you should report to your doctor or health care professional as soon as possible: -allergic reactions like skin rash, itching or hives, swelling of the face, lips, or tongue -fever, flu-like symptoms -genital sores -high blood pressure -no menstrual period for 6 weeks during use -pain, swelling, warmth in the leg -pelvic pain or tenderness -severe or sudden headache -signs of pregnancy -stomach cramping -sudden shortness of breath -trouble with balance, talking, or walking -unusual vaginal bleeding, discharge -yellowing of the eyes or skin Side effects that usually do not require medical attention (report to your doctor or health care professional if they continue or are bothersome): -acne -breast pain -change in sex drive or performance -changes in weight -cramping, dizziness, or faintness while the device is being inserted -headache -irregular menstrual bleeding within first 3 to 6 months of use -nausea This list may not describe all possible side effects. Call your doctor for medical advice about side effects. You may report side effects to FDA at 1-800-FDA-1088. Where should I keep my medicine? This does not apply. NOTE: This sheet is a summary. It may not cover all possible information. If you have questions about this medicine, talk to your doctor, pharmacist, or health care provider.  2018 Elsevier/Gold Standard (2015-10-13 14:14:56)

## 2017-03-14 NOTE — Progress Notes (Signed)
Pt is doing well.

## 2017-03-18 DIAGNOSIS — Z975 Presence of (intrauterine) contraceptive device: Secondary | ICD-10-CM | POA: Insufficient documentation

## 2017-03-18 NOTE — Progress Notes (Signed)
GYNECOLOGY OFFICE PROGRESS NOTE  History:  27 y.o. G0P0000 here today for today for IUD string check; Skyla IUD was placed  01/31/2017. No complaintsthe IUD, no concerning side effects.  Denies difficulty breathing or respiratory distress, chest pain, abdominal pain, excessive vaginal bleeding, dysuria, and leg pain or swelling.   The following portions of the patient's history were reviewed and updated as appropriate: allergies, current medications, past family history, past medical history, past social history, past surgical history and problem list. Last pap smear on 12/30/2016 was normal.  Review of Systems:   Pertinent items are noted in HPI.  Objective:   Blood pressure 110/73, pulse 85, height 5\' 5"  (1.651 m), weight 171 lb 3.2 oz (77.7 kg), last menstrual period 03/03/2017.  Physical Exam  CONSTITUTIONAL: Well-developed, well-nourished female in no acute distress.   ABDOMEN: Soft, no distention noted.    PELVIC: Normal appearing external genitalia; normal appearing vaginal mucosa and cervix.  IUD strings visualized  Assessment & Plan:   Normal IUD check.  Patient to keep IUD in place for five years; can come in for removal if she desires pregnancy within the next three (3) years.  Routine preventative health maintenance measures emphasized.   Diona Fanti, CNM [facility], [facility]

## 2017-05-15 ENCOUNTER — Ambulatory Visit (INDEPENDENT_AMBULATORY_CARE_PROVIDER_SITE_OTHER): Payer: BC Managed Care – PPO | Admitting: Certified Nurse Midwife

## 2017-05-15 VITALS — BP 113/70 | HR 76 | Ht 65.0 in | Wt 171.4 lb

## 2017-05-15 DIAGNOSIS — Z202 Contact with and (suspected) exposure to infections with a predominantly sexual mode of transmission: Secondary | ICD-10-CM | POA: Diagnosis not present

## 2017-05-15 DIAGNOSIS — N898 Other specified noninflammatory disorders of vagina: Secondary | ICD-10-CM

## 2017-05-15 MED ORDER — METRONIDAZOLE 500 MG PO TABS: 500.0000 mg | ORAL_TABLET | Freq: Two times a day (BID) | ORAL | 0 refills | Status: AC

## 2017-05-15 NOTE — Progress Notes (Signed)
GYN ENCOUNTER NOTE  Subjective:       Briana Shepherd is a 27 y.o. G0P0000 female is here for gynecologic evaluation of the following issues:  1. Vaginal discharge 2. Vaginal odor 3. Possible STD exposure  Reports decreased symptoms since last visit, but odor persists requiring frequent cleansing episodes throughout the day.   Recently, entered into a relationship and desires STI screening.   Denies difficulty breathing or respiratory distress, chest pain, abdominal pain, excessive vaginal bleeding, dysuria, and leg pain or swelling.    Gynecologic History  Patient's last menstrual period was 04/27/2017 (exact date).  Contraception: IUD, [name]  Last Pap: 12/2016. Results were: normal  Obstetric History  OB History  Gravida Para Term Preterm AB Living  0 0 0 0 0 0  SAB TAB Ectopic Multiple Live Births  0 0 0 0 0    No past medical history on file.  No past surgical history on file.  Current Outpatient Medications on File Prior to Visit  Medication Sig Dispense Refill  . Levonorgestrel (SKYLA) 13.5 MG IUD by Intrauterine route.     No current facility-administered medications on file prior to visit.     No Known Allergies  Social History   Socioeconomic History  . Marital status: Single    Spouse name: Not on file  . Number of children: Not on file  . Years of education: Not on file  . Highest education level: Not on file  Occupational History  . Not on file  Social Needs  . Financial resource strain: Not on file  . Food insecurity:    Worry: Not on file    Inability: Not on file  . Transportation needs:    Medical: Not on file    Non-medical: Not on file  Tobacco Use  . Smoking status: Never Smoker  . Smokeless tobacco: Never Used  Substance and Sexual Activity  . Alcohol use: Yes    Comment: occas  . Drug use: No  . Sexual activity: Yes    Birth control/protection: Pill  Lifestyle  . Physical activity:    Days per week: Not on file   Minutes per session: Not on file  . Stress: Not on file  Relationships  . Social connections:    Talks on phone: Not on file    Gets together: Not on file    Attends religious service: Not on file    Active member of club or organization: Not on file    Attends meetings of clubs or organizations: Not on file    Relationship status: Not on file  . Intimate partner violence:    Fear of current or ex partner: Not on file    Emotionally abused: Not on file    Physically abused: Not on file    Forced sexual activity: Not on file  Other Topics Concern  . Not on file  Social History Narrative  . Not on file    Family History  Problem Relation Age of Onset  . Diabetes Maternal Grandmother   . Stroke Maternal Grandmother     The following portions of the patient's history were reviewed and updated as appropriate: allergies, current medications, past family history, past medical history, past social history, past surgical history and problem list.  Review of Systems Review of Systems - Negative except as noted above.  History obtained from the patient  Objective:   BP 113/70   Pulse 76   Ht 5\' 5"  (1.651 m)   Wt  171 lb 7 oz (77.8 kg)   LMP 04/27/2017 (Exact Date)   BMI 28.53 kg/m    CONSTITUTIONAL: Well-developed, well-nourished female in no acute distress.   PELVIC:  External Genitalia: Normal  Vagina: Clear, white discharge present KOH+  Cervix: IUD strings present  MUSCULOSKELETAL: Normal range of motion. No tenderness.  No cyanosis, clubbing, or edema.  Assessment:   1. Vaginal discharge  - NuSwab Vaginitis Plus (VG+)  2. Vaginal odor  - NuSwab Vaginitis Plus (VG+)  3. Possible exposure to STD  - NuSwab Vaginitis Plus (VG+)   Plan:   NuSwab collected, will contact patient via MyChart with results.   Rx: Flagyl, see orders.   Discussed vaginal health techniques.   Reviewed red flag symptoms and when to call.   RTC as needed.    Diona Fanti, CNM [facility], [facility]

## 2017-05-15 NOTE — Patient Instructions (Addendum)
Metronidazole tablets or capsules What is this medicine? METRONIDAZOLE (me troe NI da zole) is an antiinfective. It is used to treat certain kinds of bacterial and protozoal infections. It will not work for colds, flu, or other viral infections. This medicine may be used for other purposes; ask your health care provider or pharmacist if you have questions. COMMON BRAND NAME(S): Flagyl What should I tell my health care provider before I take this medicine? They need to know if you have any of these conditions: -anemia or other blood disorders -disease of the nervous system -fungal or yeast infection -if you drink alcohol containing drinks -liver disease -seizures -an unusual or allergic reaction to metronidazole, or other medicines, foods, dyes, or preservatives -pregnant or trying to get pregnant -breast-feeding How should I use this medicine? Take this medicine by mouth with a full glass of water. Follow the directions on the prescription label. Take your medicine at regular intervals. Do not take your medicine more often than directed. Take all of your medicine as directed even if you think you are better. Do not skip doses or stop your medicine early. Talk to your pediatrician regarding the use of this medicine in children. Special care may be needed. Overdosage: If you think you have taken too much of this medicine contact a poison control center or emergency room at once. NOTE: This medicine is only for you. Do not share this medicine with others. What if I miss a dose? If you miss a dose, take it as soon as you can. If it is almost time for your next dose, take only that dose. Do not take double or extra doses. What may interact with this medicine? Do not take this medicine with any of the following medications: -alcohol or any product that contains alcohol -amprenavir oral solution -cisapride -disulfiram -dofetilide -dronedarone -paclitaxel injection -pimozide -ritonavir oral  solution -sertraline oral solution -sulfamethoxazole-trimethoprim injection -thioridazine -ziprasidone This medicine may also interact with the following medications: -birth control pills -cimetidine -lithium -other medicines that prolong the QT interval (cause an abnormal heart rhythm) -phenobarbital -phenytoin -warfarin This list may not describe all possible interactions. Give your health care provider a list of all the medicines, herbs, non-prescription drugs, or dietary supplements you use. Also tell them if you smoke, drink alcohol, or use illegal drugs. Some items may interact with your medicine. What should I watch for while using this medicine? Tell your doctor or health care professional if your symptoms do not improve or if they get worse. You may get drowsy or dizzy. Do not drive, use machinery, or do anything that needs mental alertness until you know how this medicine affects you. Do not stand or sit up quickly, especially if you are an older patient. This reduces the risk of dizzy or fainting spells. Avoid alcoholic drinks while you are taking this medicine and for three days afterward. Alcohol may make you feel dizzy, sick, or flushed. If you are being treated for a sexually transmitted disease, avoid sexual contact until you have finished your treatment. Your sexual partner may also need treatment. What side effects may I notice from receiving this medicine? Side effects that you should report to your doctor or health care professional as soon as possible: -allergic reactions like skin rash or hives, swelling of the face, lips, or tongue -confusion, clumsiness -difficulty speaking -discolored or sore mouth -dizziness -fever, infection -numbness, tingling, pain or weakness in the hands or feet -trouble passing urine or change in the amount of   urine -redness, blistering, peeling or loosening of the skin, including inside the mouth -seizures -unusually weak or  tired -vaginal irritation, dryness, or discharge Side effects that usually do not require medical attention (report to your doctor or health care professional if they continue or are bothersome): -diarrhea -headache -irritability -metallic taste -nausea -stomach pain or cramps -trouble sleeping This list may not describe all possible side effects. Call your doctor for medical advice about side effects. You may report side effects to FDA at 1-800-FDA-1088. Where should I keep my medicine? Keep out of the reach of children. Store at room temperature below 25 degrees C (77 degrees F). Protect from light. Keep container tightly closed. Throw away any unused medicine after the expiration date. NOTE: This sheet is a summary. It may not cover all possible information. If you have questions about this medicine, talk to your doctor, pharmacist, or health care provider.  2018 Elsevier/Gold Standard (2012-08-07 14:08:39) Vaginitis Vaginitis is a condition in which the vaginal tissue swells and becomes red (inflamed). This condition is most often caused by a change in the normal balance of bacteria and yeast that live in the vagina. This change causes an overgrowth of certain bacteria or yeast, which causes the inflammation. There are different types of vaginitis, but the most common types are:  Bacterial vaginosis.  Yeast infection (candidiasis).  Trichomoniasis vaginitis. This is a sexually transmitted disease (STD).  Viral vaginitis.  Atrophic vaginitis.  Allergic vaginitis.  What are the causes? The cause of this condition depends on the type of vaginitis. It can be caused by:  Bacteria (bacterial vaginosis).  Yeast, which is a fungus (yeast infection).  A parasite (trichomoniasis vaginitis).  A virus (viral vaginitis).  Low hormone levels (atrophic vaginitis). Low hormone levels can occur during pregnancy, breastfeeding, or after menopause.  Irritants, such as bubble baths,  scented tampons, and feminine sprays (allergic vaginitis).  Other factors can change the normal balance of the yeast and bacteria that live in the vagina. These include:  Antibiotic medicines.  Poor hygiene.  Diaphragms, vaginal sponges, spermicides, birth control pills, and intrauterine devices (IUD).  Sex.  Infection.  Uncontrolled diabetes.  A weakened defense (immune) system.  What increases the risk? This condition is more likely to develop in women who:  Smoke.  Use vaginal douches, scented tampons, or scented sanitary pads.  Wear tight-fitting pants.  Wear thong underwear.  Use oral birth control pills or an IUD.  Have sex without a condom.  Have multiple sex partners.  Have an STD.  Frequently use the spermicide nonoxynol-9.  Eat lots of foods high in sugar.  Have uncontrolled diabetes.  Have low estrogen levels.  Have a weakened immune system from an immune disorder or medical treatment.  Are pregnant or breastfeeding.  What are the signs or symptoms? Symptoms vary depending on the cause of the vaginitis. Common symptoms include:  Abnormal vaginal discharge. ? The discharge is white, gray, or yellow with bacterial vaginosis. ? The discharge is thick, white, and cheesy with a yeast infection. ? The discharge is frothy and yellow or greenish with trichomoniasis.  A bad vaginal smell. The smell is fishy with bacterial vaginosis.  Vaginal itching, pain, or swelling.  Sex that is painful.  Pain or burning when urinating.  Sometimes there are no symptoms. How is this diagnosed? This condition is diagnosed based on your symptoms and medical history. A physical exam, including a pelvic exam, will also be done. You may also have other tests, including:  Tests to determine the pH level (acidity or alkalinity) of your vagina.  A whiff test, to assess the odor that results when a sample of your vaginal discharge is mixed with a potassium hydroxide  solution.  Tests of vaginal fluid. A sample will be examined under a microscope.  How is this treated? Treatment varies depending on the type of vaginitis you have. Your treatment may include:  Antibiotic creams or pills to treat bacterial vaginosis and trichomoniasis.  Antifungal medicines, such as vaginal creams or suppositories, to treat a yeast infection.  Medicine to ease discomfort if you have viral vaginitis. Your sexual partner should also be treated.  Estrogen delivered in a cream, pill, suppository, or vaginal ring to treat atrophic vaginitis. If vaginal dryness occurs, lubricants and moisturizing creams may help. You may need to avoid scented soaps, sprays, or douches.  Stopping use of a product that is causing allergic vaginitis. Then using a vaginal cream to treat the symptoms.  Follow these instructions at home: Lifestyle  Keep your genital area clean and dry. Avoid soap, and only rinse the area with water.  Do not douche or use tampons until your health care provider says it is okay to do so. Use sanitary pads, if needed.  Do not have sex until your health care provider approves. When you can return to sex, practice safe sex and use condoms.  Wipe from front to back. This avoids the spread of bacteria from the rectum to the vagina. General instructions  Take over-the-counter and prescription medicines only as told by your health care provider.  If you were prescribed an antibiotic medicine, take or use it as told by your health care provider. Do not stop taking or using the antibiotic even if you start to feel better.  Keep all follow-up visits as told by your health care provider. This is important. How is this prevented?  Use mild, non-scented products. Do not use things that can irritate the vagina, such as fabric softeners. Avoid the following products if they are scented: ? Feminine sprays. ? Detergents. ? Tampons. ? Feminine hygiene products. ? Soaps or  bubble baths.  Let air reach your genital area. ? Wear cotton underwear to reduce moisture buildup. ? Avoid wearing underwear while you sleep. ? Avoid wearing tight pants and underwear or nylons without a cotton panel. ? Avoid wearing thong underwear.  Take off any wet clothing, such as bathing suits, as soon as possible.  Practice safe sex and use condoms. Contact a health care provider if:  You have abdominal pain.  You have a fever.  You have symptoms that last for more than 2-3 days. Get help right away if:  You have a fever and your symptoms suddenly get worse. Summary  Vaginitis is a condition in which the vaginal tissue becomes inflamed.This condition is most often caused by a change in the normal balance of bacteria and yeast that live in the vagina.  Treatment varies depending on the type of vaginitis you have.  Do not douche, use tampons , or have sex until your health care provider approves. When you can return to sex, practice safe sex and use condoms. This information is not intended to replace advice given to you by your health care provider. Make sure you discuss any questions you have with your health care provider. Document Released: 10/28/2006 Document Revised: 02/06/2016 Document Reviewed: 02/06/2016 Elsevier Interactive Patient Education  [employer].

## 2017-05-15 NOTE — Progress Notes (Signed)
Pt is here for an STD check. She also is c/o fishy smelling vaginal odor.

## 2017-05-20 LAB — NUSWAB VAGINITIS PLUS (VG+)
CANDIDA GLABRATA, NAA: NEGATIVE
Candida albicans, NAA: NEGATIVE
Chlamydia trachomatis, NAA: NEGATIVE
MEGASPHAERA 1: HIGH {score} — AB
Neisseria gonorrhoeae, NAA: NEGATIVE
Trich vag by NAA: POSITIVE — AB

## 2017-06-25 ENCOUNTER — Encounter: Payer: Self-pay | Admitting: Certified Nurse Midwife

## 2017-06-30 ENCOUNTER — Ambulatory Visit (INDEPENDENT_AMBULATORY_CARE_PROVIDER_SITE_OTHER): Payer: BC Managed Care – PPO | Admitting: Certified Nurse Midwife

## 2017-06-30 VITALS — BP 108/73 | HR 75 | Ht 65.0 in | Wt 172.3 lb

## 2017-06-30 DIAGNOSIS — Z8619 Personal history of other infectious and parasitic diseases: Secondary | ICD-10-CM | POA: Diagnosis not present

## 2017-06-30 NOTE — Patient Instructions (Signed)
Preventive Care 18-39 Years, Female Preventive care refers to lifestyle choices and visits with your health care provider that can promote health and wellness. What does preventive care include?  A yearly physical exam. This is also called an annual well check.  Dental exams once or twice a year.  Routine eye exams. Ask your health care provider how often you should have your eyes checked.  Personal lifestyle choices, including: ? Daily care of your teeth and gums. ? Regular physical activity. ? Eating a healthy diet. ? Avoiding tobacco and drug use. ? Limiting alcohol use. ? Practicing safe sex. ? Taking vitamin and mineral supplements as recommended by your health care provider. What happens during an annual well check? The services and screenings done by your health care provider during your annual well check will depend on your age, overall health, lifestyle risk factors, and family history of disease. Counseling Your health care provider may ask you questions about your:  Alcohol use.  Tobacco use.  Drug use.  Emotional well-being.  Home and relationship well-being.  Sexual activity.  Eating habits.  Work and work Statistician.  Method of birth control.  Menstrual cycle.  Pregnancy history.  Screening You may have the following tests or measurements:  Height, weight, and BMI.  Diabetes screening. This is done by checking your blood sugar (glucose) after you have not eaten for a while (fasting).  Blood pressure.  Lipid and cholesterol levels. These may be checked every 5 years starting at age 66.  Skin check.  Hepatitis C blood test.  Hepatitis B blood test.  Sexually transmitted disease (STD) testing.  BRCA-related cancer screening. This may be done if you have a family history of breast, ovarian, tubal, or peritoneal cancers.  Pelvic exam and Pap test. This may be done every 3 years starting at age 40. Starting at age 59, this may be done every 5  years if you have a Pap test in combination with an HPV test.  Discuss your test results, treatment options, and if necessary, the need for more tests with your health care provider. Vaccines Your health care provider may recommend certain vaccines, such as:  Influenza vaccine. This is recommended every year.  Tetanus, diphtheria, and acellular pertussis (Tdap, Td) vaccine. You may need a Td booster every 10 years.  Varicella vaccine. You may need this if you have not been vaccinated.  HPV vaccine. If you are 69 or younger, you may need three doses over 6 months.  Measles, mumps, and rubella (MMR) vaccine. You may need at least one dose of MMR. You may also need a second dose.  Pneumococcal 13-valent conjugate (PCV13) vaccine. You may need this if you have certain conditions and were not previously vaccinated.  Pneumococcal polysaccharide (PPSV23) vaccine. You may need one or two doses if you smoke cigarettes or if you have certain conditions.  Meningococcal vaccine. One dose is recommended if you are age 27-21 years and a first-year college student living in a residence hall, or if you have one of several medical conditions. You may also need additional booster doses.  Hepatitis A vaccine. You may need this if you have certain conditions or if you travel or work in places where you may be exposed to hepatitis A.  Hepatitis B vaccine. You may need this if you have certain conditions or if you travel or work in places where you may be exposed to hepatitis B.  Haemophilus influenzae type b (Hib) vaccine. You may need this if  you have certain risk factors.  Talk to your health care provider about which screenings and vaccines you need and how often you need them. This information is not intended to replace advice given to you by your health care provider. Make sure you discuss any questions you have with your health care provider. Document Released: 02/26/2001 Document Revised: 09/20/2015  Document Reviewed: 11/01/2014 Elsevier Interactive Patient Education  [employer].

## 2017-06-30 NOTE — Progress Notes (Signed)
Pt is here for a TOC visit. Odor is gone.

## 2017-06-30 NOTE — Progress Notes (Signed)
GYN ENCOUNTER NOTE  Subjective:       Briana Shepherd is a 27 y.o. G0P0000 female here for test of cure after treatment. No other complaints.    Gynecologic History  No LMP recorded. (Menstrual status: IUD).  Contraception: IUD, [name]  Last Pap: 12/2016. Results were: normal  Obstetric History  OB History  Gravida Para Term Preterm AB Living  0 0 0 0 0 0  SAB TAB Ectopic Multiple Live Births  0 0 0 0 0    No past medical history on file.  No past surgical history on file.  Current Outpatient Medications on File Prior to Visit  Medication Sig Dispense Refill  . Levonorgestrel (SKYLA) 13.5 MG IUD by Intrauterine route.     No current facility-administered medications on file prior to visit.     No Known Allergies  Social History   Socioeconomic History  . Marital status: Single    Spouse name: Not on file  . Number of children: Not on file  . Years of education: Not on file  . Highest education level: Not on file  Occupational History  . Not on file  Social Needs  . Financial resource strain: Not on file  . Food insecurity:    Worry: Not on file    Inability: Not on file  . Transportation needs:    Medical: Not on file    Non-medical: Not on file  Tobacco Use  . Smoking status: Never Smoker  . Smokeless tobacco: Never Used  Substance and Sexual Activity  . Alcohol use: Yes    Comment: occas  . Drug use: No  . Sexual activity: Yes    Birth control/protection: Pill  Lifestyle  . Physical activity:    Days per week: Not on file    Minutes per session: Not on file  . Stress: Not on file  Relationships  . Social connections:    Talks on phone: Not on file    Gets together: Not on file    Attends religious service: Not on file    Active member of club or organization: Not on file    Attends meetings of clubs or organizations: Not on file    Relationship status: Not on file  . Intimate partner violence:    Fear of current or ex partner: Not on file     Emotionally abused: Not on file    Physically abused: Not on file    Forced sexual activity: Not on file  Other Topics Concern  . Not on file  Social History Narrative  . Not on file    Family History  Problem Relation Age of Onset  . Diabetes Maternal Grandmother   . Stroke Maternal Grandmother     The following portions of the patient's history were reviewed and updated as appropriate: allergies, current medications, past family history, past medical history, past social history, past surgical history and problem list.  Objective:   BP 108/73   Pulse 75   Ht 5\' 5"  (1.651 m)   Wt 172 lb 5 oz (78.2 kg)   BMI 28.67 kg/m    GENERAL: Alert and oriented x 4, no apparent distress.   Physical exam: not indicated.   Assessment:   1. History of trichomoniasis  - Trichomonas vaginalis, RNA  Plan:   Labs: NuSwab self collected by patient. Will notify via MyChart with results.   Reviewed red flag symptoms and when to call; see AVS.   RTC as needed.  Lara Mulch Sharief Wainwright,CNM [facility]

## 2017-07-02 LAB — TRICHOMONAS VAGINALIS, PROBE AMP: TRICH VAG BY NAA: NEGATIVE

## 2017-12-15 ENCOUNTER — Encounter: Payer: Self-pay | Admitting: Certified Nurse Midwife

## 2017-12-15 ENCOUNTER — Ambulatory Visit (INDEPENDENT_AMBULATORY_CARE_PROVIDER_SITE_OTHER): Payer: BC Managed Care – PPO | Admitting: Certified Nurse Midwife

## 2017-12-15 ENCOUNTER — Other Ambulatory Visit (HOSPITAL_COMMUNITY)
Admission: RE | Admit: 2017-12-15 | Discharge: 2017-12-15 | Disposition: A | Discharge status: Home / Self Care | Payer: BC Managed Care – PPO | Source: Ambulatory Visit | Attending: Certified Nurse Midwife | Admitting: Certified Nurse Midwife

## 2017-12-15 VITALS — BP 110/79 | HR 78 | Ht 65.0 in | Wt 179.7 lb

## 2017-12-15 DIAGNOSIS — N6312 Unspecified lump in the right breast, upper inner quadrant: Secondary | ICD-10-CM | POA: Diagnosis not present

## 2017-12-15 DIAGNOSIS — Z113 Encounter for screening for infections with a predominantly sexual mode of transmission: Secondary | ICD-10-CM

## 2017-12-15 DIAGNOSIS — Z124 Encounter for screening for malignant neoplasm of cervix: Secondary | ICD-10-CM | POA: Diagnosis present

## 2017-12-15 DIAGNOSIS — Z975 Presence of (intrauterine) contraceptive device: Secondary | ICD-10-CM

## 2017-12-15 DIAGNOSIS — Z01419 Encounter for gynecological examination (general) (routine) without abnormal findings: Secondary | ICD-10-CM

## 2017-12-15 NOTE — Patient Instructions (Signed)
Fibrocystic Breast Changes Fibrocystic breast changes are changes that can make your breasts swollen or painful. These changes happen when tiny sacs of fluid (cysts) form in the breast. This is a common condition. It does not mean that you have cancer. It usually happens because of hormone changes during a monthly period. Follow these instructions at home:  Check your breasts after every monthly period. If you do not have monthly periods, check your breasts on the first day of every month. Check for: ? Soreness. ? New swelling or puffiness. ? A change in breast size. ? A change in a lump that was already there.  Take over-the-counter and prescription medicines only as told by your doctor.  Wear a support or sports bra that fits well. Wear this support especially when you are exercising.  Avoid or have less caffeine, fat, and sugar in what you eat and drink as told by your doctor. Contact a doctor if:  You have fluid coming from your nipple, especially if the fluid has blood in it.  You have new lumps or bumps in your breast.  Your breast gets puffy, red, and painful.  You have changes in how your breast looks.  Your nipple looks flat or it sinks into your breast. Get help right away if:  Your breast turns red, and the redness is spreading. Summary  Fibrocystic breast changes are changes that can make your breasts swollen or painful.  This condition can happen when you have hormone changes during your monthly period.  With this condition, it is important to check your breasts after every monthly period. If you do not have monthly periods, check your breasts on the first day of every month. This information is not intended to replace advice given to you by your health care provider. Make sure you discuss any questions you have with your health care provider. Document Released: 12/14/2007 Document Revised: 09/14/2015 Document Reviewed: 09/14/2015 Elsevier Interactive Patient  Education  2017 [place] 18-39 Years, Female Preventive care refers to lifestyle choices and visits with your health care provider that can promote health and wellness. What does preventive care include?  A yearly physical exam. This is also called an annual well check.  Dental exams once or twice a year.  Routine eye exams. Ask your health care provider how often you should have your eyes checked.  Personal lifestyle choices, including: ? Daily care of your teeth and gums. ? Regular physical activity. ? Eating a healthy diet. ? Avoiding tobacco and drug use. ? Limiting alcohol use. ? Practicing safe sex. ? Taking vitamin and mineral supplements as recommended by your health care provider. What happens during an annual well check? The services and screenings done by your health care provider during your annual well check will depend on your age, overall health, lifestyle risk factors, and family history of disease. Counseling Your health care provider may ask you questions about your:  Alcohol use.  Tobacco use.  Drug use.  Emotional well-being.  Home and relationship well-being.  Sexual activity.  Eating habits.  Work and work Statistician.  Method of birth control.  Menstrual cycle.  Pregnancy history.  Screening You may have the following tests or measurements:  Height, weight, and BMI.  Diabetes screening. This is done by checking your blood sugar (glucose) after you have not eaten for a while (fasting).  Blood pressure.  Lipid and cholesterol levels. These may be checked every 5 years starting at age 37.  Skin check.  Hepatitis C blood test.  Hepatitis B blood test.  Sexually transmitted disease (STD) testing.  BRCA-related cancer screening. This may be done if you have a family history of breast, ovarian, tubal, or peritoneal cancers.  Pelvic exam and Pap test. This may be done every 3 years starting at age 70. Starting at  age 17, this may be done every 5 years if you have a Pap test in combination with an HPV test.  Discuss your test results, treatment options, and if necessary, the need for more tests with your health care provider. Vaccines Your health care provider may recommend certain vaccines, such as:  Influenza vaccine. This is recommended every year.  Tetanus, diphtheria, and acellular pertussis (Tdap, Td) vaccine. You may need a Td booster every 10 years.  Varicella vaccine. You may need this if you have not been vaccinated.  HPV vaccine. If you are 29 or younger, you may need three doses over 6 months.  Measles, mumps, and rubella (MMR) vaccine. You may need at least one dose of MMR. You may also need a second dose.  Pneumococcal 13-valent conjugate (PCV13) vaccine. You may need this if you have certain conditions and were not previously vaccinated.  Pneumococcal polysaccharide (PPSV23) vaccine. You may need one or two doses if you smoke cigarettes or if you have certain conditions.  Meningococcal vaccine. One dose is recommended if you are age 1-21 years and a first-year college student living in a residence hall, or if you have one of several medical conditions. You may also need additional booster doses.  Hepatitis A vaccine. You may need this if you have certain conditions or if you travel or work in places where you may be exposed to hepatitis A.  Hepatitis B vaccine. You may need this if you have certain conditions or if you travel or work in places where you may be exposed to hepatitis B.  Haemophilus influenzae type b (Hib) vaccine. You may need this if you have certain risk factors.  Talk to your health care provider about which screenings and vaccines you need and how often you need them. This information is not intended to replace advice given to you by your health care provider. Make sure you discuss any questions you have with your health care provider. Document Released:  02/26/2001 Document Revised: 09/20/2015 Document Reviewed: 11/01/2014 Elsevier Interactive Patient Education  [employer].

## 2017-12-15 NOTE — Progress Notes (Signed)
ANNUAL PREVENTATIVE CARE GYN  ENCOUNTER NOTE  Subjective:       Briana Shepherd is a 27 y.o. G0P0000 female here for a routine annual gynecologic exam.  Current complaints: 1. Desires Pap smear and STD screening 2. Right breast lump present for the last few years; last evaluated by ultrasound three (3) years ago  Denies difficulty breathing or respiratory distress, chest pain, abdominal pain, excessive vaginal bleeding, dysuria, and leg pain or swelling.   Gynecologic History  No LMP recorded. (Menstrual status: IUD).  Contraception: IUD, Skyla placed 01/31/2017.   Last Pap: 12/30/2016. Results were: normal  Obstetric History  OB History  Gravida Para Term Preterm AB Living  0 0 0 0 0 0  SAB TAB Ectopic Multiple Live Births  0 0 0 0 0    Current Outpatient Medications on File Prior to Visit  Medication Sig Dispense Refill  . Levonorgestrel (SKYLA) 13.5 MG IUD by Intrauterine route.     No current facility-administered medications on file prior to visit.     No Known Allergies  Social History   Socioeconomic History  . Marital status: Single    Spouse name: Not on file  . Number of children: Not on file  . Years of education: Not on file  . Highest education level: Not on file  Occupational History  . Not on file  Social Needs  . Financial resource strain: Not on file  . Food insecurity:    Worry: Not on file    Inability: Not on file  . Transportation needs:    Medical: Not on file    Non-medical: Not on file  Tobacco Use  . Smoking status: Never Smoker  . Smokeless tobacco: Never Used  Substance and Sexual Activity  . Alcohol use: Yes    Comment: occasional   . Drug use: No  . Sexual activity: Yes    Birth control/protection: IUD  Lifestyle  . Physical activity:    Days per week: Not on file    Minutes per session: Not on file  . Stress: Not on file  Relationships  . Social connections:    Talks on phone: Not on file    Gets together: Not on  file    Attends religious service: Not on file    Active member of club or organization: Not on file    Attends meetings of clubs or organizations: Not on file    Relationship status: Not on file  . Intimate partner violence:    Fear of current or ex partner: Not on file    Emotionally abused: Not on file    Physically abused: Not on file    Forced sexual activity: Not on file  Other Topics Concern  . Not on file  Social History Narrative  . Not on file    Family History  Problem Relation Age of Onset  . Diabetes Maternal Grandmother   . Stroke Maternal Grandmother   . Breast cancer Neg Hx   . Ovarian cancer Neg Hx   . Colon cancer Neg Hx     The following portions of the patient's history were reviewed and updated as appropriate: allergies, current medications, past family history, past medical history, past social history, past surgical history and problem list.  Review of Systems  ROS negative except as noted above. Information obtained from patient.    Objective:   BP 110/79   Pulse 78   Ht 5\' 5"  (1.651 m)   Wt 179 lb  11.2 oz (81.5 kg)   BMI 29.90 kg/m    CONSTITUTIONAL: Well-developed, well-nourished female in no acute distress.   PSYCHIATRIC: Normal mood and affect. Normal behavior. Normal judgment and thought content.  [place]: Alert and oriented to person, place, and time. Normal muscle tone coordination. No cranial nerve deficit noted.  HENT:  Normocephalic, atraumatic, External right and left ear normal.  EYES: Conjunctivae and EOM are normal. Pupils are equal and round.   NECK: Normal range of motion, supple, no masses.  Normal thyroid.   SKIN: Skin is warm and dry. No rash noted. Not diaphoretic. No erythema. No pallor.  CARDIOVASCULAR: Normal heart rate noted, regular rhythm, no murmur.  RESPIRATORY: Clear to auscultation bilaterally. Effort and breath sounds normal, no problems with respiration noted.  BREASTS: Symmetric in size. Left breast:  no masses, skin changes, nipple drainage, or lymphadenopathy. Right beast: dime sized round mobile lump present at 2 o'clock  ABDOMEN: Soft, normal bowel sounds, no distention noted.  No tenderness, rebound or guarding. Overweight.   PELVIC:  External Genitalia: Normal  Vagina: Normal  Cervix: Normal, string present at os  Uterus: Normal  Adnexa: Normal  MUSCULOSKELETAL: Normal range of motion. No tenderness.  No cyanosis, clubbing, or edema.  2+ distal pulses.  LYMPHATIC: No Axillary, Supraclavicular, or Inguinal Adenopathy.  Assessment:   Annual gynecologic examination 27 y.o.   Contraception: IUD, Skyla   Overweight   Problem List Items Addressed This Visit      Other   IUD (intrauterine device) in place    Other Visit Diagnoses    Well woman exam    -  Primary   Relevant Orders   US BREAST LTD UNI RIGHT INC AXILLA   Cytology - PAP   Screen for STD (sexually transmitted disease)       Relevant Orders   Cytology - PAP   Breast lump on right side at 2 o'clock position       Relevant Orders   US BREAST LTD UNI RIGHT INC AXILLA   Screening for cervical cancer       Relevant Orders   Cytology - PAP      Plan:   Pap: Pap, Reflex if ASCUS, vaginitis and STI screen included  Mammogram/Ultrasound: Ordered   Labs: Declined  Routine preventative health maintenance measures emphasized: Exercise/Diet/Weight control, Tobacco Warnings, Alcohol/Substance use risks, Stress Management, Peer Pressure Issues and Safe Sex; see AVS  Reviewed red flag symptoms and when to call  RTC x 1 year for ANNUAL EXAM or sooner if needed   Diona Fanti, CNM [facility], [facility] 12/15/17 5:02 PM

## 2017-12-15 NOTE — Progress Notes (Signed)
Patient here for annual exam.  Patient request STD testing and has had a right breast lump x8 years she will like to have reevaluated.  Patient had breast U/S 3 years ago with benign findings, denies breast pain.

## 2017-12-19 ENCOUNTER — Encounter: Payer: Self-pay | Admitting: Certified Nurse Midwife

## 2017-12-19 LAB — CYTOLOGY - PAP
BACTERIAL VAGINITIS: POSITIVE — AB
CANDIDA VAGINITIS: NEGATIVE
Chlamydia: NEGATIVE
DIAGNOSIS: NEGATIVE
Neisseria Gonorrhea: NEGATIVE
TRICH (WINDOWPATH): NEGATIVE

## 2017-12-22 ENCOUNTER — Other Ambulatory Visit: Payer: Self-pay

## 2017-12-22 MED ORDER — SECNIDAZOLE 2 G PO PACK: 1.0000 | PACK | Freq: Once | ORAL | 0 refills | Status: AC

## 2017-12-25 ENCOUNTER — Other Ambulatory Visit: Payer: Self-pay

## 2017-12-25 MED ORDER — METRONIDAZOLE 500 MG PO TABS: 500.0000 mg | ORAL_TABLET | Freq: Two times a day (BID) | ORAL | 0 refills | Status: DC

## 2018-03-02 ENCOUNTER — Encounter: Payer: Self-pay | Admitting: Certified Nurse Midwife

## 2018-03-31 ENCOUNTER — Other Ambulatory Visit: Payer: Self-pay

## 2018-03-31 ENCOUNTER — Ambulatory Visit: Payer: BC Managed Care – PPO | Admitting: Family Medicine

## 2018-03-31 ENCOUNTER — Encounter: Payer: Self-pay | Admitting: Family Medicine

## 2018-03-31 VITALS — BP 120/60 | HR 64 | Ht 65.0 in | Wt 178.0 lb

## 2018-03-31 DIAGNOSIS — K219 Gastro-esophageal reflux disease without esophagitis: Secondary | ICD-10-CM | POA: Diagnosis not present

## 2018-03-31 DIAGNOSIS — Z7689 Persons encountering health services in other specified circumstances: Secondary | ICD-10-CM | POA: Diagnosis not present

## 2018-03-31 MED ORDER — PANTOPRAZOLE SODIUM 40 MG PO TBEC: 40.0000 mg | DELAYED_RELEASE_TABLET | Freq: Every day | ORAL | 1 refills | Status: DC

## 2018-03-31 NOTE — Progress Notes (Signed)
Date:  03/31/2018   Name:  Briana Shepherd   DOB:  1990/05/14   MRN:  [id number]   Chief Complaint: [place] (needing pcp) and Gastroesophageal Reflux (watching diet to control- a burning in mid chest that last from a minute to a couple of days. No chest pain)  Patient is a 28 year old female who presents for a comprehensive physical exam. The patient reports the following problems: heartburn. Health maintenance has been reviewed   Gastroesophageal Reflux  She complains of heartburn. She reports no abdominal pain, no belching, no chest pain, no choking, no coughing, no dysphagia, no early satiety, no globus sensation, no hoarse voice, no nausea, no sore throat, no stridor, no tooth decay, no water brash or no wheezing. This is a chronic problem. The current episode started more than 1 year ago. The problem occurs constantly. The problem has been waxing and waning. The heartburn is located in the substernum. The heartburn is of moderate intensity. The symptoms are aggravated by certain foods. Pertinent negatives include no anemia, fatigue, melena, muscle weakness, orthopnea or weight loss. There are no known risk factors. She has tried nothing for the symptoms. The treatment provided moderate relief. Past procedures do not include an abdominal ultrasound, an EGD, esophageal manometry, esophageal pH monitoring, H. pylori antibody titer or a UGI.    Review of Systems  Constitutional: Negative.  Negative for chills, fatigue, fever, unexpected weight change and weight loss.  HENT: Negative for congestion, ear discharge, ear pain, hoarse voice, rhinorrhea, sinus pressure, sneezing and sore throat.   Eyes: Negative for photophobia, pain, discharge, redness and itching.  Respiratory: Negative for cough, choking, shortness of breath, wheezing and stridor.   Cardiovascular: Negative for chest pain.  Gastrointestinal: Positive for heartburn. Negative for abdominal pain, blood in stool,  constipation, diarrhea, dysphagia, melena, nausea and vomiting.  Endocrine: Negative for cold intolerance, heat intolerance, polydipsia, polyphagia and polyuria.  Genitourinary: Negative for dysuria, flank pain, frequency, hematuria, menstrual problem, pelvic pain, urgency, vaginal bleeding and vaginal discharge.  Musculoskeletal: Negative for arthralgias, back pain, myalgias and muscle weakness.  Skin: Negative for rash.  Allergic/Immunologic: Negative for environmental allergies and food allergies.  Neurological: Negative for dizziness, weakness, light-headedness, numbness and headaches.  Hematological: Negative for adenopathy. Does not bruise/bleed easily.  Psychiatric/Behavioral: Negative for dysphoric mood. The patient is not nervous/anxious.     Patient Active Problem List   Diagnosis Date Noted  . IUD (intrauterine device) in place 03/18/2017    No Known Allergies  History reviewed. No pertinent surgical history.  Social History   Tobacco Use  . Smoking status: Never Smoker  . Smokeless tobacco: Never Used  Substance Use Topics  . Alcohol use: Yes    Comment: occasional   . Drug use: No     Medication list has been reviewed and updated.  Current Meds  Medication Sig  . Levonorgestrel (SKYLA) 13.5 MG IUD by Intrauterine route.    PHQ 2/9 Scores 03/31/2018  PHQ - 2 Score 0  PHQ- 9 Score 2    Physical Exam Vitals signs and nursing note reviewed.  Constitutional:      General: She is not in acute distress.    Appearance: She is well-developed and overweight. She is not diaphoretic.  HENT:     Head: Normocephalic and atraumatic.     Jaw: There is normal jaw occlusion.     Right Ear: Hearing, tympanic membrane, ear canal and external ear normal.     Left  Ear: Hearing, tympanic membrane, ear canal and external ear normal.     Nose: Nose normal.     Mouth/Throat:     Mouth: Mucous membranes are moist.     Pharynx: Oropharynx is clear. Uvula midline.      Tonsils: No tonsillar exudate.  Eyes:     General: Lids are normal.        Right eye: No discharge.        Left eye: No discharge.     Extraocular Movements: Extraocular movements intact.     Conjunctiva/sclera: Conjunctivae normal.     Pupils: Pupils are equal, round, and reactive to light.  Neck:     Musculoskeletal: Full passive range of motion without pain, normal range of motion and neck supple.     Thyroid: Thyromegaly present. No thyroid mass or thyroid tenderness.     Vascular: No JVD.  Cardiovascular:     Rate and Rhythm: Normal rate and regular rhythm.     Chest Wall: PMI is not displaced.     Pulses: Normal pulses. No decreased pulses.     Heart sounds: Normal heart sounds. No murmur. No friction rub. No gallop.   Pulmonary:     Effort: Pulmonary effort is normal.     Breath sounds: Normal breath sounds. No decreased breath sounds.  Abdominal:     General: Bowel sounds are normal.     Palpations: Abdomen is soft. There is no hepatomegaly, splenomegaly or mass.     Tenderness: There is no abdominal tenderness. There is no guarding.  Musculoskeletal: Normal range of motion.  Lymphadenopathy:     Cervical: No cervical adenopathy.  Skin:    General: Skin is warm and dry.  Neurological:     Mental Status: She is alert.     Deep Tendon Reflexes: Reflexes are normal and symmetric.  Psychiatric:        Behavior: Behavior is cooperative.     Wt Readings from Last 3 Encounters:  03/31/18 178 lb (80.7 kg)  12/15/17 179 lb 11.2 oz (81.5 kg)  06/30/17 172 lb 5 oz (78.2 kg)    BP 120/60   Pulse 64   Ht 5\' 5"  (1.651 m)   Wt 178 lb (80.7 kg)   BMI 29.62 kg/m   Assessment and Plan: 1. Establishing care with new doctor, encounter for Patient establish care with new physician.  Patient's previous records were reviewed as well as her labs, imaging, specialty notes.  2. Gastroesophageal reflux disease, esophagitis presence not specified Admits to having some reflux issues  and would like to have a trial of something.  Patient will be prescribed pantoprazole 40 mg once a day.

## 2018-03-31 NOTE — Patient Instructions (Signed)

## 2018-04-01 ENCOUNTER — Telehealth: Payer: Self-pay | Admitting: Certified Nurse Midwife

## 2018-04-01 ENCOUNTER — Telehealth: Payer: Self-pay

## 2018-04-01 NOTE — Telephone Encounter (Signed)
See mychart messages

## 2018-04-01 NOTE — Telephone Encounter (Signed)
The patient sent a MyChart request to have a "self-swab" done, just confirming what I need to schedule exactly? Or if I need to reach out and get more information? Thank you.

## 2018-04-01 NOTE — Telephone Encounter (Signed)
The patient called and stated she needs to speak with the nurse Sharon,The patient did not disclose any other information. Please advise.

## 2018-04-03 ENCOUNTER — Encounter

## 2018-04-06 ENCOUNTER — Other Ambulatory Visit: Payer: Self-pay

## 2018-04-06 ENCOUNTER — Ambulatory Visit (INDEPENDENT_AMBULATORY_CARE_PROVIDER_SITE_OTHER): Payer: BC Managed Care – PPO | Admitting: Certified Nurse Midwife

## 2018-04-06 ENCOUNTER — Other Ambulatory Visit: Payer: BC Managed Care – PPO

## 2018-04-06 ENCOUNTER — Other Ambulatory Visit (HOSPITAL_COMMUNITY)
Admission: RE | Admit: 2018-04-06 | Discharge: 2018-04-06 | Disposition: A | Discharge status: Home / Self Care | Payer: BC Managed Care – PPO | Source: Ambulatory Visit | Attending: Certified Nurse Midwife | Admitting: Certified Nurse Midwife

## 2018-04-06 DIAGNOSIS — N898 Other specified noninflammatory disorders of vagina: Secondary | ICD-10-CM | POA: Insufficient documentation

## 2018-04-06 NOTE — Progress Notes (Signed)
Patient here for self collection of vaginal swab, see orders. Will contact patient with results and needed treatment.    Diona Fanti, CNM [facility], [facility] 04/06/18 2:32 PM

## 2018-04-06 NOTE — Patient Instructions (Signed)
Vaginitis  Vaginitis is a condition in which the vaginal tissue swells and becomes red (inflamed). This condition is most often caused by a change in the normal balance of bacteria and yeast that live in the vagina. This change causes an overgrowth of certain bacteria or yeast, which causes the inflammation. There are different types of vaginitis, but the most common types are:   Bacterial vaginosis.   Yeast infection (candidiasis).   Trichomoniasis vaginitis. This is a sexually transmitted disease (STD).   Viral vaginitis.   Atrophic vaginitis.   Allergic vaginitis.  What are the causes?  The cause of this condition depends on the type of vaginitis. It can be caused by:   Bacteria (bacterial vaginosis).   Yeast, which is a fungus (yeast infection).   A parasite (trichomoniasis vaginitis).   A virus (viral vaginitis).   Low hormone levels (atrophic vaginitis). Low hormone levels can occur during pregnancy, breastfeeding, or after menopause.   Irritants, such as bubble baths, scented tampons, and feminine sprays (allergic vaginitis).  Other factors can change the normal balance of the yeast and bacteria that live in the vagina. These include:   Antibiotic medicines.   Poor hygiene.   Diaphragms, vaginal sponges, spermicides, birth control pills, and intrauterine devices (IUD).   Sex.   Infection.   Uncontrolled diabetes.   A weakened defense (immune) system.  What increases the risk?  This condition is more likely to develop in women who:   Smoke.   Use vaginal douches, scented tampons, or scented sanitary pads.   Wear tight-fitting pants.   Wear thong underwear.   Use oral birth control pills or an IUD.   Have sex without a condom.   Have multiple sex partners.   Have an STD.   Frequently use the spermicide nonoxynol-9.   Eat lots of foods high in sugar.   Have uncontrolled diabetes.   Have low estrogen levels.   Have a weakened immune system from an immune disorder or medical  treatment.   Are pregnant or breastfeeding.  What are the signs or symptoms?  Symptoms vary depending on the cause of the vaginitis. Common symptoms include:   Abnormal vaginal discharge.  ? The discharge is white, gray, or yellow with bacterial vaginosis.  ? The discharge is thick, white, and cheesy with a yeast infection.  ? The discharge is frothy and yellow or greenish with trichomoniasis.   A bad vaginal smell. The smell is fishy with bacterial vaginosis.   Vaginal itching, pain, or swelling.   Sex that is painful.   Pain or burning when urinating.  Sometimes there are no symptoms.  How is this diagnosed?  This condition is diagnosed based on your symptoms and medical history. A physical exam, including a pelvic exam, will also be done. You may also have other tests, including:   Tests to determine the pH level (acidity or alkalinity) of your vagina.   A whiff test, to assess the odor that results when a sample of your vaginal discharge is mixed with a potassium hydroxide solution.   Tests of vaginal fluid. A sample will be examined under a microscope.  How is this treated?  Treatment varies depending on the type of vaginitis you have. Your treatment may include:   Antibiotic creams or pills to treat bacterial vaginosis and trichomoniasis.   Antifungal medicines, such as vaginal creams or suppositories, to treat a yeast infection.   Medicine to ease discomfort if you have viral vaginitis. Your sexual partner   should also be treated.   Estrogen delivered in a cream, pill, suppository, or vaginal ring to treat atrophic vaginitis. If vaginal dryness occurs, lubricants and moisturizing creams may help. You may need to avoid scented soaps, sprays, or douches.   Stopping use of a product that is causing allergic vaginitis. Then using a vaginal cream to treat the symptoms.  Follow these instructions at home:  Lifestyle   Keep your genital area clean and dry. Avoid soap, and only rinse the area with  water.   Do not douche or use tampons until your health care provider says it is okay to do so. Use sanitary pads, if needed.   Do not have sex until your health care provider approves. When you can return to sex, practice safe sex and use condoms.   Wipe from front to back. This avoids the spread of bacteria from the rectum to the vagina.  General instructions   Take over-the-counter and prescription medicines only as told by your health care provider.   If you were prescribed an antibiotic medicine, take or use it as told by your health care provider. Do not stop taking or using the antibiotic even if you start to feel better.   Keep all follow-up visits as told by your health care provider. This is important.  How is this prevented?   Use mild, non-scented products. Do not use things that can irritate the vagina, such as fabric softeners. Avoid the following products if they are scented:  ? Feminine sprays.  ? Detergents.  ? Tampons.  ? Feminine hygiene products.  ? Soaps or bubble baths.   Let air reach your genital area.  ? Wear cotton underwear to reduce moisture buildup.  ? Avoid wearing underwear while you sleep.  ? Avoid wearing tight pants and underwear or nylons without a cotton panel.  ? Avoid wearing thong underwear.   Take off any wet clothing, such as bathing suits, as soon as possible.   Practice safe sex and use condoms.  Contact a health care provider if:   You have abdominal pain.   You have a fever.   You have symptoms that last for more than 2-3 days.  Get help right away if:   You have a fever and your symptoms suddenly get worse.  Summary   Vaginitis is a condition in which the vaginal tissue becomes inflamed.This condition is most often caused by a change in the normal balance of bacteria and yeast that live in the vagina.   Treatment varies depending on the type of vaginitis you have.   Do not douche, use tampons , or have sex until your health care provider approves. When  you can return to sex, practice safe sex and use condoms.  This information is not intended to replace advice given to you by your health care provider. Make sure you discuss any questions you have with your health care provider.  Document Released: 10/28/2006 Document Revised: 02/06/2016 Document Reviewed: 02/06/2016  Elsevier Interactive Patient Education  2019 [employer]

## 2018-04-06 NOTE — Telephone Encounter (Signed)
Please contact patient, may come in for self collection of vaginal swab and then will contact patient with results. Thanks, JML

## 2018-04-08 LAB — CERVICOVAGINAL ANCILLARY ONLY
Bacterial vaginitis: POSITIVE — AB
Candida vaginitis: NEGATIVE
Trichomonas: NEGATIVE

## 2018-04-10 ENCOUNTER — Encounter: Payer: Self-pay | Admitting: Certified Nurse Midwife

## 2018-04-11 ENCOUNTER — Other Ambulatory Visit: Payer: Self-pay | Admitting: Certified Nurse Midwife

## 2018-04-11 DIAGNOSIS — N760 Acute vaginitis: Principal | ICD-10-CM

## 2018-04-11 DIAGNOSIS — B9689 Other specified bacterial agents as the cause of diseases classified elsewhere: Secondary | ICD-10-CM

## 2018-04-11 MED ORDER — SECNIDAZOLE 2 G PO PACK: 1.0000 | PACK | Freq: Once | ORAL | 0 refills | Status: AC

## 2018-04-13 ENCOUNTER — Other Ambulatory Visit: Payer: Self-pay | Admitting: Certified Nurse Midwife

## 2018-04-13 MED ORDER — METRONIDAZOLE 500 MG PO TABS: 500.0000 mg | ORAL_TABLET | Freq: Two times a day (BID) | ORAL | 0 refills | Status: AC

## 2018-04-13 NOTE — Progress Notes (Signed)
f °

## 2018-04-20 ENCOUNTER — Encounter: Payer: Self-pay | Admitting: Certified Nurse Midwife

## 2018-05-07 ENCOUNTER — Encounter: Payer: Self-pay | Admitting: Certified Nurse Midwife

## 2018-06-03 ENCOUNTER — Ambulatory Visit: Payer: BC Managed Care – PPO | Admitting: Family Medicine

## 2018-10-02 ENCOUNTER — Other Ambulatory Visit: Payer: Self-pay | Admitting: Certified Nurse Midwife

## 2018-10-02 ENCOUNTER — Ambulatory Visit (INDEPENDENT_AMBULATORY_CARE_PROVIDER_SITE_OTHER): Payer: BC Managed Care – PPO | Admitting: Certified Nurse Midwife

## 2018-10-02 ENCOUNTER — Other Ambulatory Visit: Payer: Self-pay

## 2018-10-02 ENCOUNTER — Other Ambulatory Visit (HOSPITAL_COMMUNITY)
Admission: RE | Admit: 2018-10-02 | Discharge: 2018-10-02 | Disposition: A | Discharge status: Home / Self Care | Payer: BC Managed Care – PPO | Source: Ambulatory Visit | Attending: Certified Nurse Midwife | Admitting: Certified Nurse Midwife

## 2018-10-02 ENCOUNTER — Encounter: Payer: Self-pay | Admitting: Certified Nurse Midwife

## 2018-10-02 VITALS — BP 123/77 | HR 75 | Ht 65.0 in | Wt 181.5 lb

## 2018-10-02 DIAGNOSIS — Z113 Encounter for screening for infections with a predominantly sexual mode of transmission: Secondary | ICD-10-CM | POA: Insufficient documentation

## 2018-10-02 NOTE — Progress Notes (Signed)
Patient here for STD testing, denies having symptoms.

## 2018-10-02 NOTE — Patient Instructions (Signed)

## 2018-10-02 NOTE — Progress Notes (Signed)
GYN ENCOUNTER NOTE  Subjective:       Briana Shepherd is a 28 y.o. G0P0000 female here for self collection of vaginal swab. Desires STI screening. No symptoms at this time.   Gynecologic History  No LMP recorded (lmp unknown). (Menstrual status: IUD).  Contraception: IUD, Skyla placed 01/31/2017  Last Pap: 12/2016. Results were: normal  Obstetric History  OB History  Gravida Para Term Preterm AB Living  0 0 0 0 0 0  SAB TAB Ectopic Multiple Live Births  0 0 0 0 0    Current Outpatient Medications on File Prior to Visit  Medication Sig Dispense Refill  . Levonorgestrel (SKYLA) 13.5 MG IUD by Intrauterine route.    . pantoprazole (PROTONIX) 40 MG tablet Take 1 tablet (40 mg total) by mouth daily. 90 tablet 1   No current facility-administered medications on file prior to visit.     No Known Allergies  Social History   Socioeconomic History  . Marital status: Single    Spouse name: Not on file  . Number of children: Not on file  . Years of education: Not on file  . Highest education level: Not on file  Occupational History  . Not on file  Social Needs  . Financial resource strain: Not on file  . Food insecurity    Worry: Not on file    Inability: Not on file  . Transportation needs    Medical: Not on file    Non-medical: Not on file  Tobacco Use  . Smoking status: Never Smoker  . Smokeless tobacco: Never Used  Substance and Sexual Activity  . Alcohol use: Yes    Comment: occasional   . Drug use: No  . Sexual activity: Yes    Birth control/protection: I.U.D.  Lifestyle  . Physical activity    Days per week: Not on file    Minutes per session: Not on file  . Stress: Not on file  Relationships  . Social Herbalist on phone: Not on file    Gets together: Not on file    Attends religious service: Not on file    Active member of club or organization: Not on file    Attends meetings of clubs or organizations: Not on file    Relationship status:  Not on file  . Intimate partner violence    Fear of current or ex partner: Not on file    Emotionally abused: Not on file    Physically abused: Not on file    Forced sexual activity: Not on file  Other Topics Concern  . Not on file  Social History Narrative  . Not on file    Family History  Problem Relation Age of Onset  . Diabetes Maternal Grandmother   . Stroke Maternal Grandmother   . Breast cancer Neg Hx   . Ovarian cancer Neg Hx   . Colon cancer Neg Hx     The following portions of the patient's history were reviewed and updated as appropriate: allergies, current medications, past family history, past medical history, past social history, past surgical history and problem list.   Objective:   BP 123/77   Pulse 75   Ht 5\' 5"  (1.651 m)   Wt 181 lb 8 oz (82.3 kg)   LMP  (LMP Unknown)   BMI 30.20 kg/m    CONSTITUTIONAL: Well-developed, well-nourished female in no acute distress.   PHYSICAL EXAM: Not indicated.   Assessment:   1. Screen for  STD (sexually transmitted disease) - Cervicovaginal ancillary only   Plan:   Vaginal swab self collected by patient.   See orders, will contact with results.   RTC as needed.    Diona Fanti, CNM [facility], [facility] 10/02/18 10:42 AM

## 2018-10-05 ENCOUNTER — Other Ambulatory Visit: Payer: Self-pay

## 2018-10-05 ENCOUNTER — Encounter: Payer: Self-pay | Admitting: Certified Nurse Midwife

## 2018-10-05 LAB — CERVICOVAGINAL ANCILLARY ONLY
Bacterial Vaginitis (gardnerella): POSITIVE — AB
Candida Glabrata: NEGATIVE
Candida Vaginitis: NEGATIVE
Molecular Disclaimer: NEGATIVE
Molecular Disclaimer: NEGATIVE
Molecular Disclaimer: NEGATIVE
Molecular Disclaimer: NORMAL
Trichomonas: NEGATIVE

## 2018-10-05 MED ORDER — METRONIDAZOLE 500 MG PO TABS: 500.0000 mg | ORAL_TABLET | Freq: Two times a day (BID) | ORAL | 0 refills | Status: DC

## 2018-10-07 LAB — CERVICOVAGINAL ANCILLARY ONLY
Chlamydia: NEGATIVE
Neisseria Gonorrhea: NEGATIVE

## 2018-12-17 ENCOUNTER — Encounter: Payer: BC Managed Care – PPO | Admitting: Certified Nurse Midwife

## 2018-12-31 ENCOUNTER — Other Ambulatory Visit (HOSPITAL_COMMUNITY)
Admission: RE | Admit: 2018-12-31 | Discharge: 2018-12-31 | Disposition: A | Discharge status: Home / Self Care | Payer: BC Managed Care – PPO | Source: Ambulatory Visit | Attending: Obstetrics and Gynecology | Admitting: Obstetrics and Gynecology

## 2018-12-31 ENCOUNTER — Encounter: Payer: Self-pay | Admitting: Obstetrics and Gynecology

## 2018-12-31 ENCOUNTER — Ambulatory Visit (INDEPENDENT_AMBULATORY_CARE_PROVIDER_SITE_OTHER): Payer: BC Managed Care – PPO | Admitting: Obstetrics and Gynecology

## 2018-12-31 ENCOUNTER — Other Ambulatory Visit: Payer: Self-pay

## 2018-12-31 VITALS — BP 91/58 | HR 75 | Ht 65.0 in | Wt 181.9 lb

## 2018-12-31 DIAGNOSIS — Z87898 Personal history of other specified conditions: Secondary | ICD-10-CM | POA: Diagnosis not present

## 2018-12-31 DIAGNOSIS — N760 Acute vaginitis: Secondary | ICD-10-CM

## 2018-12-31 DIAGNOSIS — Z975 Presence of (intrauterine) contraceptive device: Secondary | ICD-10-CM

## 2018-12-31 DIAGNOSIS — E669 Obesity, unspecified: Secondary | ICD-10-CM

## 2018-12-31 DIAGNOSIS — Z113 Encounter for screening for infections with a predominantly sexual mode of transmission: Secondary | ICD-10-CM | POA: Diagnosis not present

## 2018-12-31 DIAGNOSIS — Z01419 Encounter for gynecological examination (general) (routine) without abnormal findings: Secondary | ICD-10-CM

## 2018-12-31 NOTE — Progress Notes (Signed)
Pt is present for annual exam. Pt declined flu vaccine. Pt stated that she was doing well no problems. Requested STD screening.

## 2018-12-31 NOTE — Patient Instructions (Addendum)
Preventive Care 21-29 Years Old, Female Preventive care refers to visits with your health care provider and lifestyle choices that can promote health and wellness. This includes:  A yearly physical exam. This may also be called an annual well check.  Regular dental visits and eye exams.  Immunizations.  Screening for certain conditions.  Healthy lifestyle choices, such as eating a healthy diet, getting regular exercise, not using drugs or products that contain nicotine and tobacco, and limiting alcohol use. What can I expect for my preventive care visit? Physical exam Your health care provider will check your:  Height and weight. This may be used to calculate body mass index (BMI), which tells if you are at a healthy weight.  Heart rate and blood pressure.  Skin for abnormal spots. Counseling Your health care provider may ask you questions about your:  Alcohol, tobacco, and drug use.  Emotional well-being.  Home and relationship well-being.  Sexual activity.  Eating habits.  Work and work environment.  Method of birth control.  Menstrual cycle.  Pregnancy history. What immunizations do I need?  Influenza (flu) vaccine  This is recommended every year. Tetanus, diphtheria, and pertussis (Tdap) vaccine  You may need a Td booster every 10 years. Varicella (chickenpox) vaccine  You may need this if you have not been vaccinated. Human papillomavirus (HPV) vaccine  If recommended by your health care provider, you may need three doses over 6 months. Measles, mumps, and rubella (MMR) vaccine  You may need at least one dose of MMR. You may also need a second dose. Meningococcal conjugate (MenACWY) vaccine  One dose is recommended if you are age 19-21 years and a first-year college student living in a residence hall, or if you have one of several medical conditions. You may also need additional booster doses. Pneumococcal conjugate (PCV13) vaccine  You may need  this if you have certain conditions and were not previously vaccinated. Pneumococcal polysaccharide (PPSV23) vaccine  You may need one or two doses if you smoke cigarettes or if you have certain conditions. Hepatitis A vaccine  You may need this if you have certain conditions or if you travel or work in places where you may be exposed to hepatitis A. Hepatitis B vaccine  You may need this if you have certain conditions or if you travel or work in places where you may be exposed to hepatitis B. Haemophilus influenzae type b (Hib) vaccine  You may need this if you have certain conditions. You may receive vaccines as individual doses or as more than one vaccine together in one shot (combination vaccines). Talk with your health care provider about the risks and benefits of combination vaccines. What tests do I need?  Blood tests  Lipid and cholesterol levels. These may be checked every 5 years starting at age 20.  Hepatitis C test.  Hepatitis B test. Screening  Diabetes screening. This is done by checking your blood sugar (glucose) after you have not eaten for a while (fasting).  Sexually transmitted disease (STD) testing.  BRCA-related cancer screening. This may be done if you have a family history of breast, ovarian, tubal, or peritoneal cancers.  Pelvic exam and Pap test. This may be done every 3 years starting at age 21. Starting at age 30, this may be done every 5 years if you have a Pap test in combination with an HPV test. Talk with your health care provider about your test results, treatment options, and if necessary, the need for more tests.   Follow these instructions at home: °Eating and drinking ° °· Eat a diet that includes fresh fruits and vegetables, whole grains, lean protein, and low-fat dairy. °· Take vitamin and mineral supplements as recommended by your health care provider. °· Do not drink alcohol if: °? Your health care provider tells you not to drink. °? You are  pregnant, may be pregnant, or are planning to become pregnant. °· If you drink alcohol: °? Limit how much you have to 0-1 drink a day. °? Be aware of how much alcohol is in your drink. In the [place] one drink equals one 12 oz bottle of beer (355 mL), one 5 oz glass of wine (148 mL), or one 1½ oz glass of hard liquor (44 mL). °Lifestyle °· Take daily care of your teeth and gums. °· Stay active. Exercise for at least 30 minutes on 5 or more days each week. °· Do not use any products that contain nicotine or tobacco, such as cigarettes, e-cigarettes, and chewing tobacco. If you need help quitting, ask your health care provider. °· If you are sexually active, practice safe sex. Use a condom or other form of birth control (contraception) in order to prevent pregnancy and STIs (sexually transmitted infections). If you plan to become pregnant, see your health care provider for a preconception visit. °What's next? °· Visit your health care provider once a year for a well check visit. °· Ask your health care provider how often you should have your eyes and teeth checked. °· Stay up to date on all vaccines. °This information is not intended to replace advice given to you by your health care provider. Make sure you discuss any questions you have with your health care provider. °Document Released: 02/26/2001 Document Revised: 09/11/2017 Document Reviewed: 09/11/2017 °Elsevier Patient Education © 2020 [employer] Inc[employer] °Breast Self-Awareness °Breast self-awareness is knowing how your breasts look and feel. Doing breast self-awareness is important. It allows you to catch a breast problem early while it is still small and can be treated. All women should do breast self-awareness, including women who have had breast implants. Tell your doctor if you notice a change in your breasts. °What you need: °· A mirror. °· A well-lit room. °How to do a breast self-exam °A breast self-exam is one way to learn what is normal for your breasts and to  check for changes. To do a breast self-exam: °Look for changes ° °1. Take off all the clothes above your waist. °2. Stand in front of a mirror in a room with good lighting. °3. Put your hands on your hips. °4. Push your hands down. °5. Look at your breasts and nipples in the mirror to see if one breast or nipple looks different from the other. Check to see if: °? The shape of one breast is different. °? The size of one breast is different. °? There are wrinkles, dips, and bumps in one breast and not the other. °6. Look at each breast for changes in the skin, such as: °? Redness. °? Scaly areas. °7. Look for changes in your nipples, such as: °? Liquid around the nipples. °? Bleeding. °? Dimpling. °? Redness. °? A change in where the nipples are. °Feel for changes ° °1. Lie on your back on the floor. °2. Feel each breast. To do this, follow these steps: °? Pick a breast to feel. °? Put the arm closest to that breast above your head. °? Use your other arm to feel the nipple area of   your breast. Feel the area with the pads of your three middle fingers by making small circles with your fingers. For the first circle, press lightly. For the second circle, press harder. For the third circle, press even harder. ? Keep making circles with your fingers at the different pressures as you move down your breast. Stop when you feel your ribs. ? Move your fingers a little toward the center of your body. ? Start making circles with your fingers again, this time going up until you reach your collarbone. ? Keep making up-and-down circles until you reach your armpit. Remember to keep using the three pressures. ? Feel the other breast in the same way. 3. Sit or stand in the tub or shower. 4. With soapy water on your skin, feel each breast the same way you did in step 2 when you were lying on the floor. Write down what you find Writing down what you find can help you remember what to tell your doctor. Write down:  What is  normal for each breast.  Any changes you find in each breast, including: ? The kind of changes you find. ? Whether you have pain. ? Size and location of any lumps.  When you last had your menstrual period. General tips  Check your breasts every month.  If you are breastfeeding, the best time to check your breasts is after you feed your baby or after you use a breast pump.  If you get menstrual periods, the best time to check your breasts is 5-7 days after your menstrual period is over.  With time, you will become comfortable with the self-exam, and you will begin to know if there are changes in your breasts. Contact a doctor if you:  See a change in the shape or size of your breasts or nipples.  See a change in the skin of your breast or nipples, such as red or scaly skin.  Have fluid coming from your nipples that is not normal.  Find a lump or thick area that was not there before.  Have pain in your breasts.  Have any concerns about your breast health. Summary  Breast self-awareness includes looking for changes in your breasts, as well as feeling for changes within your breasts.  Breast self-awareness should be done in front of a mirror in a well-lit room.  You should check your breasts every month. If you get menstrual periods, the best time to check your breasts is 5-7 days after your menstrual period is over.  Let your doctor know of any changes you see in your breasts, including changes in size, changes on the skin, pain or tenderness, or fluid from your nipples that is not normal. This information is not intended to replace advice given to you by your health care provider. Make sure you discuss any questions you have with your health care provider. Document Released: 06/19/2007 Document Revised: 08/19/2017 Document Reviewed: 08/19/2017 Elsevier Patient Education  [place]:   1. First and foremost, eat a healthy  diet rich in fruits and vegetables and low in sugar and refined carbohydrates. Studies have shown that yeast and harmful bacteria are more likely to be a problem in people with high sugar diets. One food believed to be particularly helpful is garlic. 2. Support your immune system: reduce stress, get adequate sleep and exercise regularly. 3. Eat plain, probiotic yogurt daily or take a daily vaginal probiotic (can be found in  the feminine aisle at any pharmacy) containing Acidophillus (they should contain at least 1 billion CFU's- this is information found on the bottle).  4. Avoid harsh soaps, and detergents.  Can use vaginal washes like Vagisil or Summer's Eve.  5.Avoid routine douching, and if you notice that sex can trigger infections for you, use condoms. For some women who get infections easily, both douching and the alkaline nature of semen can disrupt the vaginal pH. 6. You can use vaginal suppositories (tea tree oil and/or coconut oil). These can be purchased on line.

## 2018-12-31 NOTE — Progress Notes (Addendum)
GYNECOLOGY ANNUAL PHYSICAL EXAM PROGRESS NOTE  Subjective:    Briana Shepherd is a 28 y.o. [place] female who presents for an annual exam. She is a patient of Dani Gobble, CNM (out on maternity leave).The patient has no complaints today. The patient is sexually active. The patient wears seatbelts: yes. The patient participates in regular exercise: no.  The patient reports that there is not domestic violence in her life.    1. Notes more recurring issues with BV infections over the past year. Notes no concerns for infection at this time, but just desires to be screened. Also desires STI screening.  Denies concerns about exposure. 2. Has history of a stable right breast mass (has had for many years). Was unable to f/u with getting a breast ultrasound due to COVID pandemic.    Gynecologic History Menarche age: 49 No LMP recorded. (Menstrual status: IUD). Contraception: Skyla IUD History of STI's: Trichomoniasis, in 2018 or 2019.  Last Pap: 12/2016. Results were: normal.  Denies h/o abnormal pap smears.   OB History  Gravida Para Term Preterm AB Living  0 0 0 0 0 0  SAB TAB Ectopic Multiple Live Births  0 0 0 0 0    History reviewed. No pertinent past medical history.  History reviewed. No pertinent surgical history.  Family History  Problem Relation Age of Onset  . Diabetes Maternal Grandmother   . Stroke Maternal Grandmother   . HIV/AIDS Mother   . Breast cancer Neg Hx   . Ovarian cancer Neg Hx   . Colon cancer Neg Hx     Social History   Socioeconomic History  . Marital status: Single    Spouse name: Not on file  . Number of children: Not on file  . Years of education: Not on file  . Highest education level: Not on file  Occupational History  . Not on file  Tobacco Use  . Smoking status: Never Smoker  . Smokeless tobacco: Never Used  Substance and Sexual Activity  . Alcohol use: Yes    Comment: occasional   . Drug use: No  . Sexual activity: Yes   Birth control/protection: I.U.D.  Other Topics Concern  . Not on file  Social History Narrative  . Not on file   Social Determinants of Health   Financial Resource Strain:   . Difficulty of Paying Living Expenses: Not on file  Food Insecurity:   . Worried About Charity fundraiser in the Last Year: Not on file  . Ran Out of Food in the Last Year: Not on file  Transportation Needs:   . Lack of Transportation (Medical): Not on file  . Lack of Transportation (Non-Medical): Not on file  Physical Activity:   . Days of Exercise per Week: Not on file  . Minutes of Exercise per Session: Not on file  Stress:   . Feeling of Stress : Not on file  Social Connections:   . Frequency of Communication with Friends and Family: Not on file  . Frequency of Social Gatherings with Friends and Family: Not on file  . Attends Religious Services: Not on file  . Active Member of Clubs or Organizations: Not on file  . Attends Archivist Meetings: Not on file  . Marital Status: Not on file  Intimate Partner Violence:   . Fear of Current or Ex-Partner: Not on file  . Emotionally Abused: Not on file  . Physically Abused: Not on file  . Sexually Abused:  Not on file    Current Outpatient Medications on File Prior to Visit  Medication Sig Dispense Refill  . Levonorgestrel (SKYLA) 13.5 MG IUD by Intrauterine route.    . Multiple Vitamin (MULTI-DAY VITAMINS PO) Take by mouth.     No current facility-administered medications on file prior to visit.    No Known Allergies    Review of Systems Constitutional: negative for chills, fatigue, fevers and sweats Eyes: negative for irritation, redness and visual disturbance Ears, nose, mouth, throat, and face: negative for hearing loss, nasal congestion, snoring and tinnitus Respiratory: negative for asthma, cough, sputum Cardiovascular: negative for chest pain, dyspnea, exertional chest pressure/discomfort, irregular heart beat, palpitations and  syncope Gastrointestinal: negative for abdominal pain, change in bowel habits, nausea and vomiting Genitourinary: negative for abnormal menstrual periods, genital lesions, sexual problems and vaginal discharge, dysuria and urinary incontinence Integument/breast: positive for stable right for breast lump. Negative for breast tenderness and nipple discharge Hematologic/lymphatic: negative for bleeding and easy bruising Musculoskeletal:negative for back pain and muscle weakness Neurological: negative for dizziness, headaches, vertigo and weakness Endocrine: negative for diabetic symptoms including polydipsia, polyuria and skin dryness Allergic/Immunologic: negative for hay fever and urticaria      Objective:  Blood pressure (!) 91/58, pulse 75, height 5\' 5"  (1.651 m), weight 181 lb 14.4 oz (82.5 kg). Body mass index is 30.27 kg/m.  General Appearance:    Alert, cooperative, no distress, appears stated age, mild obesity  Head:    Normocephalic, without obvious abnormality, atraumatic  Eyes:    PERRL, conjunctiva/corneas clear, EOM's intact, both eyes  Ears:    Normal external ear canals, both ears  Nose:   Nares normal, septum midline, mucosa normal, no drainage or sinus tenderness  Throat:   Lips, mucosa, and tongue normal; teeth and gums normal  Neck:   Supple, symmetrical, trachea midline, no adenopathy; thyroid: no enlargement/tenderness/nodules; no carotid bruit or JVD  Back:     Symmetric, no curvature, ROM normal, no CVA tenderness  Lungs:     Clear to auscultation bilaterally, respirations unlabored  Chest Wall:    No tenderness or deformity   Heart:    Regular rate and rhythm, S1 and S2 normal, no murmur, rub or gallop  Breast Exam:    No tenderness, or nipple abnormality. Fibrocystic changes noted in right breast, with ~ 2 x 2.5 cm nodule in upper inner quadrant, mobile, firm.   Abdomen:     Soft, non-tender, bowel sounds active all four quadrants, no masses, no organomegaly.      Genitalia:    Pelvic:external genitalia normal, vagina without lesions, discharge, or tenderness, rectovaginal septum  normal. Cervix normal in appearance, no cervical motion tenderness. IUD threads not easily visible, retrieval with cyotbrush and Kelly forceps, shortened. No adnexal masses or tenderness.  Uterus normal size, shape, mobile, regular contours, nontender.  Rectal:    Normal external sphincter.  No hemorrhoids appreciated. Internal exam not done.   Extremities:   Extremities normal, atraumatic, no cyanosis or edema  Pulses:   2+ and symmetric all extremities  Skin:   Skin color, texture, turgor normal, no rashes or lesions  Lymph nodes:   Cervical, supraclavicular, and axillary nodes normal  Neurologic:   CNII-XII intact, normal strength, sensation and reflexes throughout   .  Labs:  Lab Results  Component Value Date   WBC 4.5 01/10/2017   HGB 12.7 01/10/2017   HCT 40.4 01/10/2017   MCV 85 01/10/2017   PLT 331 01/10/2017  Lab Results  Component Value Date   CREATININE 0.91 01/10/2017   BUN 11 01/10/2017   NA 140 01/10/2017   K 4.5 01/10/2017   CL 103 01/10/2017   CO2 23 01/10/2017    Lab Results  Component Value Date   ALT 13 01/10/2017   AST 18 01/10/2017   ALKPHOS 72 01/10/2017   BILITOT 0.4 01/10/2017    Lab Results  Component Value Date   CHOL 198 01/10/2017   HDL 59 01/10/2017   LDLCALC 119 (H) 01/10/2017   TRIG 98 01/10/2017   CHOLHDL 3.4 01/10/2017     No results found for: TSH   Assessment:   Encounter for well woman exam with routine gynecological exam  Screening for STD (sexually transmitted disease) Recurrent vaginitis History of lump of right breast Mild obesity IUD in place.   Plan:    - Blood tests: CBC with diff and Comprehensive metabolic panel. - Breast self exam technique reviewed and patient encouraged to perform self-exam monthly. - Contraception: Skyla IUD. - Discussed healthy lifestyle modifications. - Pap smear  up to date. Due in 1 year.  - Declines flu vaccine.  - Discussed that recurrent vaginitis may be secondary to IUD in place as symptoms started roughly around the same time.  Patient reports that she is taking a probiotic daily. Also recommended increasing plain yogurt intake, use of coconut oil and tea-tree oil suppositories, mild soaps and detergents.  - History of breast lump, has been stable for years, not bothersome to patient. Advised that breast ultrasound was not necessary unless change in size/shape or symptoms noted (tenderness, pain, nipple discharge).  - RTC in 1 year for annual exam with Dani Gobble, CNM.    Rubie Maid, MD [facility]

## 2019-01-04 ENCOUNTER — Other Ambulatory Visit: Payer: BC Managed Care – PPO

## 2019-01-04 ENCOUNTER — Other Ambulatory Visit: Payer: Self-pay

## 2019-01-04 LAB — CERVICOVAGINAL ANCILLARY ONLY
Bacterial Vaginitis (gardnerella): NEGATIVE
Candida Glabrata: NEGATIVE
Candida Vaginitis: NEGATIVE
Chlamydia: NEGATIVE
Comment: NEGATIVE
Comment: NEGATIVE
Comment: NEGATIVE
Comment: NEGATIVE
Comment: NEGATIVE
Comment: NORMAL
Neisseria Gonorrhea: NEGATIVE
Trichomonas: NEGATIVE

## 2019-01-05 LAB — COMPREHENSIVE METABOLIC PANEL
ALT: 140 IU/L — ABNORMAL HIGH (ref 0–32)
AST: 79 IU/L — ABNORMAL HIGH (ref 0–40)
Albumin/Globulin Ratio: 1.4 (ref 1.2–2.2)
Albumin: 4.2 g/dL (ref 3.9–5.0)
Alkaline Phosphatase: 95 IU/L (ref 39–117)
BUN/Creatinine Ratio: 10 (ref 9–23)
BUN: 10 mg/dL (ref 6–20)
Bilirubin Total: 0.5 mg/dL (ref 0.0–1.2)
CO2: 23 mmol/L (ref 20–29)
Calcium: 9.1 mg/dL (ref 8.7–10.2)
Chloride: 102 mmol/L (ref 96–106)
Creatinine, Ser: 0.98 mg/dL (ref 0.57–1.00)
GFR calc Af Amer: 90 mL/min/{1.73_m2} (ref >59)
GFR calc non Af Amer: 78 mL/min/{1.73_m2} (ref >59)
Globulin, Total: 2.9 g/dL (ref 1.5–4.5)
Glucose: 87 mg/dL (ref 65–99)
Potassium: 4.4 mmol/L (ref 3.5–5.2)
Sodium: 140 mmol/L (ref 134–144)
Total Protein: 7.1 g/dL (ref 6.0–8.5)

## 2019-01-05 LAB — CBC
Hematocrit: 39.1 % (ref 34.0–46.6)
Hemoglobin: 12.6 g/dL (ref 11.1–15.9)
MCH: 27.2 pg (ref 26.6–33.0)
MCHC: 32.2 g/dL (ref 31.5–35.7)
MCV: 84 fL (ref 79–97)
Platelets: 317 10*3/uL (ref 150–450)
RBC: 4.63 x10E6/uL (ref 3.77–5.28)
RDW: 12.1 % (ref 11.7–15.4)
WBC: 5 10*3/uL (ref 3.4–10.8)

## 2019-03-01 ENCOUNTER — Encounter: Payer: BC Managed Care – PPO | Admitting: Certified Nurse Midwife

## 2019-05-13 ENCOUNTER — Telehealth: Payer: Self-pay | Admitting: Certified Nurse Midwife

## 2019-05-13 ENCOUNTER — Other Ambulatory Visit: Payer: Self-pay

## 2019-05-13 ENCOUNTER — Ambulatory Visit (INDEPENDENT_AMBULATORY_CARE_PROVIDER_SITE_OTHER): Payer: BC Managed Care – PPO | Admitting: Certified Nurse Midwife

## 2019-05-13 VITALS — BP 109/71 | HR 82 | Ht 65.0 in | Wt 183.9 lb

## 2019-05-13 DIAGNOSIS — N760 Acute vaginitis: Secondary | ICD-10-CM

## 2019-05-13 DIAGNOSIS — B9689 Other specified bacterial agents as the cause of diseases classified elsewhere: Secondary | ICD-10-CM

## 2019-05-13 DIAGNOSIS — N898 Other specified noninflammatory disorders of vagina: Secondary | ICD-10-CM

## 2019-05-13 NOTE — Progress Notes (Signed)
Pt present for self-swab for possible yeast infection. Pt stated that she only notice the vaginal itching at night x 2 days.

## 2019-05-13 NOTE — Telephone Encounter (Signed)
Patient would like to come in for a self swab, is this okay with you? Please advise.

## 2019-05-13 NOTE — Telephone Encounter (Signed)
May add to nurse schedule. Inquire what should would like swab to test. Thanks, JML

## 2019-05-13 NOTE — Patient Instructions (Signed)
Bacterial Vaginosis  Bacterial vaginosis is a vaginal infection that occurs when the normal balance of bacteria in the vagina is disrupted. It results from an overgrowth of certain bacteria. This is the most common vaginal infection among women ages 15-44. Because bacterial vaginosis increases your risk for STIs (sexually transmitted infections), getting treated can help reduce your risk for chlamydia, gonorrhea, herpes, and HIV (human immunodeficiency virus). Treatment is also important for preventing complications in pregnant women, because this condition can cause an early (premature) delivery. What are the causes? This condition is caused by an increase in harmful bacteria that are normally present in small amounts in the vagina. However, the reason that the condition develops is not fully understood. What increases the risk? The following factors may make you more likely to develop this condition:  Having a new sexual partner or multiple sexual partners.  Having unprotected sex.  Douching.  Having an intrauterine device (IUD).  Smoking.  Drug and alcohol abuse.  Taking certain antibiotic medicines.  Being pregnant. You cannot get bacterial vaginosis from toilet seats, bedding, swimming pools, or contact with objects around you. What are the signs or symptoms? Symptoms of this condition include:  Grey or white vaginal discharge. The discharge can also be watery or foamy.  A fish-like odor with discharge, especially after sexual intercourse or during menstruation.  Itching in and around the vagina.  Burning or pain with urination. Some women with bacterial vaginosis have no signs or symptoms. How is this diagnosed? This condition is diagnosed based on:  Your medical history.  A physical exam of the vagina.  Testing a sample of vaginal fluid under a microscope to look for a large amount of bad bacteria or abnormal cells. Your health care provider may use a cotton swab or  a small wooden spatula to collect the sample. How is this treated? This condition is treated with antibiotics. These may be given as a pill, a vaginal cream, or a medicine that is put into the vagina (suppository). If the condition comes back after treatment, a second round of antibiotics may be needed. Follow these instructions at home: Medicines  Take over-the-counter and prescription medicines only as told by your health care provider.  Take or use your antibiotic as told by your health care provider. Do not stop taking or using the antibiotic even if you start to feel better. General instructions  If you have a female sexual partner, tell her that you have a vaginal infection. She should see her health care provider and be treated if she has symptoms. If you have a female sexual partner, he does not need treatment.  During treatment: ? Avoid sexual activity until you finish treatment. ? Do not douche. ? Avoid alcohol as directed by your health care provider. ? Avoid breastfeeding as directed by your health care provider.  Drink enough water and fluids to keep your urine clear or pale yellow.  Keep the area around your vagina and rectum clean. ? Wash the area daily with warm water. ? Wipe yourself from front to back after using the toilet.  Keep all follow-up visits as told by your health care provider. This is important. How is this prevented?  Do not douche.  Wash the outside of your vagina with warm water only.  Use protection when having sex. This includes latex condoms and dental dams.  Limit how many sexual partners you have. To help prevent bacterial vaginosis, it is best to have sex with just one partner (  monogamous).  Make sure you and your sexual partner are tested for STIs.  Wear cotton or cotton-lined underwear.  Avoid wearing tight pants and pantyhose, especially during summer.  Limit the amount of alcohol that you drink.  Do not use any products that contain  nicotine or tobacco, such as cigarettes and e-cigarettes. If you need help quitting, ask your health care provider.  Do not use illegal drugs. Where to find more information  Centers for Disease Control and Prevention: AppraiserFraud.fi  American Sexual Health Association (ASHA): www.ashastd.org  U.S. Department of Health and Financial controller, Office on Women's Health: DustingSprays.pl or SecuritiesCard.it Contact a health care provider if:  Your symptoms do not improve, even after treatment.  You have more discharge or pain when urinating.  You have a fever.  You have pain in your abdomen.  You have pain during sex.  You have vaginal bleeding between periods. Summary  Bacterial vaginosis is a vaginal infection that occurs when the normal balance of bacteria in the vagina is disrupted.  Because bacterial vaginosis increases your risk for STIs (sexually transmitted infections), getting treated can help reduce your risk for chlamydia, gonorrhea, herpes, and HIV (human immunodeficiency virus). Treatment is also important for preventing complications in pregnant women, because the condition can cause an early (premature) delivery.  This condition is treated with antibiotic medicines. These may be given as a pill, a vaginal cream, or a medicine that is put into the vagina (suppository). This information is not intended to replace advice given to you by your health care provider. Make sure you discuss any questions you have with your health care provider. Document Revised: 12/13/2016 Document Reviewed: 09/16/2015 Elsevier Patient Education  [place]. Vaginitis  Vaginitis is irritation and swelling (inflammation) of the vagina. It happens when normal bacteria and yeast in the vagina grow too much. There are many types of this condition. Treatment will depend on the type you have. Follow these instructions at home: Lifestyle  Keep  your vagina area clean and dry. ? Avoid using soap. ? Rinse the area with water.  Do not do the following until your doctor says it is okay: ? Wash and clean out the vagina (douche). ? Use tampons. ? Have sex.  Wipe from front to back after going to the bathroom.  Let air reach your vagina. ? Wear cotton underwear. ? Do not wear:  Underwear while you sleep.  Tight pants.  Thong underwear.  Underwear or nylons without a cotton panel. ? Take off any wet clothing, such as bathing suits, as soon as possible.  Use gentle, non-scented products. Do not use things that can irritate the vagina, such as fabric softeners. Avoid the following products if they are scented: ? Feminine sprays. ? Detergents. ? Tampons. ? Feminine hygiene products. ? Soaps or bubble baths.  Practice safe sex and use condoms. General instructions  Take over-the-counter and prescription medicines only as told by your doctor.  If you were prescribed an antibiotic medicine, take or use it as told by your doctor. Do not stop taking or using the antibiotic even if you start to feel better.  Keep all follow-up visits as told by your doctor. This is important. Contact a doctor if:  You have pain in your belly.  You have a fever.  Your symptoms last for more than 2-3 days. Get help right away if:  You have a fever and your symptoms get worse all of a sudden. Summary  Vaginitis is irritation and  swelling of the vagina. It can happen when the normal bacteria and yeast in the vagina grow too much. There are many types.  Treatment will depend on the type you have.  Do not douche, use tampons , or have sex until your health care provider approves. When you can return to sex, practice safe sex and use condoms. This information is not intended to replace advice given to you by your health care provider. Make sure you discuss any questions you have with your health care provider. Document Revised: 12/13/2016  Document Reviewed: 01/23/2016 Elsevier Patient Education  2020 [employer].

## 2019-05-13 NOTE — Telephone Encounter (Signed)
Called patient to put her on nurse schedule. Patient is having some vaginal discharge with unusual discharge. Patient thinks she may have a yeast infection.

## 2019-05-15 LAB — NUSWAB VAGINITIS PLUS (VG+)
Atopobium vaginae: HIGH Score — AB
BVAB 2: HIGH Score — AB
Candida albicans, NAA: POSITIVE — AB
Candida glabrata, NAA: NEGATIVE
Chlamydia trachomatis, NAA: NEGATIVE
Neisseria gonorrhoeae, NAA: NEGATIVE
Trich vag by NAA: NEGATIVE

## 2019-05-17 ENCOUNTER — Other Ambulatory Visit (INDEPENDENT_AMBULATORY_CARE_PROVIDER_SITE_OTHER): Payer: BC Managed Care – PPO | Admitting: Certified Nurse Midwife

## 2019-05-17 DIAGNOSIS — B379 Candidiasis, unspecified: Secondary | ICD-10-CM

## 2019-05-17 DIAGNOSIS — B9689 Other specified bacterial agents as the cause of diseases classified elsewhere: Secondary | ICD-10-CM

## 2019-05-17 DIAGNOSIS — N760 Acute vaginitis: Secondary | ICD-10-CM

## 2019-05-17 MED ORDER — FLUCONAZOLE 150 MG PO TABS: 150.0000 mg | ORAL_TABLET | Freq: Once | ORAL | 0 refills | Status: AC

## 2019-05-17 MED ORDER — METRONIDAZOLE 500 MG PO TABS: 500.0000 mg | ORAL_TABLET | Freq: Two times a day (BID) | ORAL | 0 refills | Status: AC

## 2019-05-17 NOTE — Progress Notes (Signed)
Rx: Flagyl and Diflucan, see orders.    Diona Fanti, CNM [facility], [facility] 05/17/19 10:02 AM

## 2019-11-22 ENCOUNTER — Other Ambulatory Visit: Payer: Self-pay

## 2019-11-22 ENCOUNTER — Ambulatory Visit (INDEPENDENT_AMBULATORY_CARE_PROVIDER_SITE_OTHER): Payer: BC Managed Care – PPO | Admitting: Certified Nurse Midwife

## 2019-11-22 ENCOUNTER — Encounter: Payer: Self-pay | Admitting: Certified Nurse Midwife

## 2019-11-22 VITALS — BP 109/70 | HR 81 | Ht 65.0 in | Wt 175.1 lb

## 2019-11-22 DIAGNOSIS — Z30433 Encounter for removal and reinsertion of intrauterine contraceptive device: Secondary | ICD-10-CM | POA: Insufficient documentation

## 2019-11-22 DIAGNOSIS — N921 Excessive and frequent menstruation with irregular cycle: Secondary | ICD-10-CM

## 2019-11-22 DIAGNOSIS — Z975 Presence of (intrauterine) contraceptive device: Secondary | ICD-10-CM

## 2019-11-22 NOTE — Progress Notes (Signed)
Pt present for IUD removal and reinsertion. Pt stated that she has noticed some changes in her cycle the last few months and wondering if it the IUD. Pt would like to have her IUD removed and a new one reinserted today.

## 2019-11-22 NOTE — Patient Instructions (Signed)
IUD PLACEMENT POST-PROCEDURE INSTRUCTIONS  1. You may take Ibuprofen, Aleve or Tylenol for pain if needed.  Cramping should resolve within in 24 hours.  2. You may have a small amount of spotting.  You should wear a mini pad for the next few days.  3. You may have intercourse after 72 hours.  If you using this for birth control, it is effective immediately.  4. You need to call if you have any pelvic pain, fever, heavy bleeding or foul smelling vaginal discharge.  Irregular bleeding is common the first several months after having an IUD placed. You do not need to call for this reason unless you are concerned.  5. Shower or bathe as normal  6. You should have a follow-up appointment in 4-8 weeks for a re-check to make sure you are not having any problems.   Levonorgestrel intrauterine device (IUD) What is this medicine? LEVONORGESTREL IUD (LEE voe nor jes trel) is a contraceptive (birth control) device. The device is placed inside the uterus by a healthcare professional. It is used to prevent pregnancy. This device can also be used to treat heavy bleeding that occurs during your period. This medicine may be used for other purposes; ask your health care provider or pharmacist if you have questions. COMMON BRAND NAME(S): Kyleena, LILETTA, Mirena, Skyla What should I tell my health care provider before I take this medicine? They need to know if you have any of these conditions:  abnormal Pap smear  cancer of the breast, uterus, or cervix  diabetes  endometritis  genital or pelvic infection now or in the past  have more than one sexual partner or your partner has more than one partner  heart disease  history of an ectopic or tubal pregnancy  immune system problems  IUD in place  liver disease or tumor  problems with blood clots or take blood-thinners  seizures  use intravenous drugs  uterus of unusual shape  vaginal bleeding that has not been explained  an unusual or  allergic reaction to levonorgestrel, other hormones, silicone, or polyethylene, medicines, foods, dyes, or preservatives  pregnant or trying to get pregnant  breast-feeding How should I use this medicine? This device is placed inside the uterus by a health care professional. Talk to your pediatrician regarding the use of this medicine in children. Special care may be needed. Overdosage: If you think you have taken too much of this medicine contact a poison control center or emergency room at once. NOTE: This medicine is only for you. Do not share this medicine with others. What if I miss a dose? This does not apply. Depending on the brand of device you have inserted, the device will need to be replaced every 3 to 6 years if you wish to continue using this type of birth control. What may interact with this medicine? Do not take this medicine with any of the following medications:  amprenavir  bosentan  fosamprenavir This medicine may also interact with the following medications:  aprepitant  armodafinil  barbiturate medicines for inducing sleep or treating seizures  bexarotene  boceprevir  griseofulvin  medicines to treat seizures like carbamazepine, ethotoin, felbamate, oxcarbazepine, phenytoin, topiramate  modafinil  pioglitazone  rifabutin  rifampin  rifapentine  some medicines to treat HIV infection like atazanavir, efavirenz, indinavir, lopinavir, nelfinavir, tipranavir, ritonavir  St. John's wort  warfarin This list may not describe all possible interactions. Give your health care provider a list of all the medicines, herbs, non-prescription drugs, or   dietary supplements you use. Also tell them if you smoke, drink alcohol, or use illegal drugs. Some items may interact with your medicine. What should I watch for while using this medicine? Visit your doctor or health care professional for regular check ups. See your doctor if you or your partner has sexual  contact with others, becomes HIV positive, or gets a sexual transmitted disease. This product does not protect you against HIV infection (AIDS) or other sexually transmitted diseases. You can check the placement of the IUD yourself by reaching up to the top of your vagina with clean fingers to feel the threads. Do not pull on the threads. It is a good habit to check placement after each menstrual period. Call your doctor right away if you feel more of the IUD than just the threads or if you cannot feel the threads at all. The IUD may come out by itself. You may become pregnant if the device comes out. If you notice that the IUD has come out use a backup birth control method like condoms and call your health care provider. Using tampons will not change the position of the IUD and are okay to use during your period. This IUD can be safely scanned with magnetic resonance imaging (MRI) only under specific conditions. Before you have an MRI, tell your healthcare provider that you have an IUD in place, and which type of IUD you have in place. What side effects may I notice from receiving this medicine? Side effects that you should report to your doctor or health care professional as soon as possible:  allergic reactions like skin rash, itching or hives, swelling of the face, lips, or tongue  fever, flu-like symptoms  genital sores  high blood pressure  no menstrual period for 6 weeks during use  pain, swelling, warmth in the leg  pelvic pain or tenderness  severe or sudden headache  signs of pregnancy  stomach cramping  sudden shortness of breath  trouble with balance, talking, or walking  unusual vaginal bleeding, discharge  yellowing of the eyes or skin Side effects that usually do not require medical attention (report to your doctor or health care professional if they continue or are bothersome):  acne  breast pain  change in sex drive or performance  changes in  weight  cramping, dizziness, or faintness while the device is being inserted  headache  irregular menstrual bleeding within first 3 to 6 months of use  nausea This list may not describe all possible side effects. Call your doctor for medical advice about side effects. You may report side effects to FDA at 1-800-FDA-1088. Where should I keep my medicine? This does not apply. NOTE: This sheet is a summary. It may not cover all possible information. If you have questions about this medicine, talk to your doctor, pharmacist, or health care provider.  2020 Elsevier/Gold Standard (2017-11-11 13:22:01)  

## 2019-11-22 NOTE — Progress Notes (Signed)
Briana Shepherd is a 29 y.o. year old [place] female who presents for removal and replacement of a Skyla IUD. She was given informed consent for removal and reinsertion of her Skyla.  Her [name] was placed 01/31/2017; previously amenorrheic and notes menses for the last two (2) months.   The risks and benefits of the method and placement have been thouroughly reviewed with the patient and all questions were answered.  Specifically the patient is aware of failure rate of 01/998, expulsion of the IUD and of possible perforation.  The patient is aware of irregular bleeding due to the method and understands the incidence of irregular bleeding diminishes with time.  Signed copy of informed consent in chart.   BP 109/70   Pulse 81   Ht 5\' 5"  (1.651 m)   Wt 175 lb 1.6 oz (79.4 kg)   BMI 29.14 kg/m    Appropriate time out taken. A small plastic speculum was placed in the vagina.  The cervix was visualized, prepped using Betadine. The strings were visible. They were grasped and the [name] was easily removed. The cervix was then grasped with a single-tooth tenaculum. The uterus was sounded to 8 cm.  Skyla IUD placed per manufacturer's recommendations without complications. The strings were trimmed to 3 cm.  The patient tolerated the procedure well.   The patient was given post procedure instructions, including signs and symptoms of infection and to check for the strings after each menses or each month, and refraining from intercourse or anything in the vagina for 3 days.  She was given a [name] care card with date Skyla placed, and date Skyla to be removed.  Reviewed red flag symptoms and when to call.   RTC as previously scheduled or sooner if needed.    Dani Gobble, CNM [facility], [facility] 11/22/19 3:35 PM   NDC: 81829-937-16 Lot: [id number] Exp: 01/2022

## 2019-12-17 ENCOUNTER — Other Ambulatory Visit: Payer: Self-pay | Admitting: Certified Nurse Midwife

## 2019-12-17 DIAGNOSIS — N760 Acute vaginitis: Secondary | ICD-10-CM

## 2019-12-17 MED ORDER — METRONIDAZOLE 0.75 % VA GEL
1.0000 | Freq: Every day | VAGINAL | 5 refills | Status: DC
Start: 2019-12-17 — End: 2020-11-20

## 2019-12-17 NOTE — Progress Notes (Signed)
Rx Metrogel due to recurrent vaginitis, see orders and MyChart messages.    Dani Gobble, CNM [facility], [facility] 12/17/19 2:42 PM

## 2019-12-22 ENCOUNTER — Ambulatory Visit (HOSPITAL_COMMUNITY)
Admission: EM | Admit: 2019-12-22 | Discharge: 2019-12-22 | Disposition: A | Discharge status: Home / Self Care | Payer: BC Managed Care – PPO | Attending: Family Medicine | Admitting: Family Medicine

## 2019-12-22 ENCOUNTER — Other Ambulatory Visit: Payer: Self-pay

## 2019-12-22 ENCOUNTER — Encounter (HOSPITAL_COMMUNITY): Payer: Self-pay | Admitting: Emergency Medicine

## 2019-12-22 DIAGNOSIS — Z793 Long term (current) use of hormonal contraceptives: Secondary | ICD-10-CM | POA: Insufficient documentation

## 2019-12-22 DIAGNOSIS — J029 Acute pharyngitis, unspecified: Secondary | ICD-10-CM | POA: Insufficient documentation

## 2019-12-22 DIAGNOSIS — Z20822 Contact with and (suspected) exposure to covid-19: Secondary | ICD-10-CM | POA: Insufficient documentation

## 2019-12-22 NOTE — ED Triage Notes (Addendum)
Pt presents with sore/ scratchy throat xs 3 days. States loss of voice started today. Denies fever.

## 2019-12-23 LAB — SARS CORONAVIRUS 2 (TAT 6-24 HRS): SARS Coronavirus 2: NEGATIVE

## 2019-12-23 NOTE — ED Provider Notes (Signed)
[place]    CSN: [id number] Arrival date & time: 12/22/19  1925      History   Chief Complaint Chief Complaint  Patient presents with  . Sore Throat    HPI Briana Shepherd is a 29 y.o. female.   Here today with 3 day hx of sore, scratchy throat and hoarse voice. Denies fever, chills, cough, body aches, fatigue, CP, SOB, abdominal pain, N/V/D, rashes. Hot tea controlling sore throat sxs well. Denies sick contacts, chronic medical problems. UTD on COVID vaccines.      History reviewed. No pertinent past medical history.  Patient Active Problem List   Diagnosis Date Noted  . Encounter for removal and reinsertion of IUD 11/22/2019  . IUD (intrauterine device) in place 03/18/2017    History reviewed. No pertinent surgical history.  OB History    Gravida  0   Para  0   Term  0   Preterm  0   AB  0   Living  0     SAB  0   IAB  0   Ectopic  0   Multiple  0   Live Births  0            Home Medications    Prior to Admission medications   Medication Sig Start Date End Date Taking? Authorizing Provider  Levonorgestrel (SKYLA) 13.5 MG IUD by Intrauterine route. Inserted 11/22/2019 by JML.    [provider]  metroNIDAZOLE (METROGEL) 0.75 % vaginal gel Place 1 Applicatorful vaginally at bedtime. Apply one applicatorful to vagina at bedtime for 10 days, then twice a week for 6 months. 12/17/19   Diona Fanti, CNM  Multiple Vitamin (MULTI-DAY VITAMINS PO) Take by mouth.    [provider]    Family History Family History  Problem Relation Age of Onset  . Diabetes Maternal Grandmother   . Stroke Maternal Grandmother   . HIV/AIDS Mother   . Breast cancer Neg Hx   . Ovarian cancer Neg Hx   . Colon cancer Neg Hx     Social History Social History   Tobacco Use  . Smoking status: Never Smoker  . Smokeless tobacco: Never Used  Vaping Use  . Vaping Use: Never used  Substance Use Topics  . Alcohol use:  Yes    Comment: occasional   . Drug use: No     Allergies   Patient has no known allergies.   Review of Systems Review of Systems PER HPI   Physical Exam Triage Vital Signs ED Triage Vitals  Enc Vitals Group     BP 12/22/19 1957 (!) 114/53     Pulse Rate 12/22/19 1957 84     Resp 12/22/19 1957 16     Temp 12/22/19 1957 98.7 F (37.1 C)     Temp Source 12/22/19 1957 Oral     SpO2 12/22/19 1957 100 %     Weight --      Height --      Head Circumference --      Peak Flow --      Pain Score 12/22/19 1955 0     Pain Loc --      Pain Edu? --      Excl. in [place]? --    No data found.  Updated Vital Signs BP (!) 114/53 (BP Location: Left Arm)   Pulse 84   Temp 98.7 F (37.1 C) (Oral)   Resp 16   SpO2 100%  Visual Acuity Right Eye Distance:   Left Eye Distance:   Bilateral Distance:    Right Eye Near:   Left Eye Near:    Bilateral Near:     Physical Exam Vitals and nursing note reviewed.  Constitutional:      Appearance: Normal appearance. She is not ill-appearing.  HENT:     Head: Atraumatic.     Right Ear: Tympanic membrane normal.     Left Ear: Tympanic membrane normal.     Nose: Nose normal.     Mouth/Throat:     Mouth: Mucous membranes are moist.     Pharynx: Posterior oropharyngeal erythema (minimal erythema and tonsillar edema, no exudates) present.  Eyes:     Extraocular Movements: Extraocular movements intact.     Conjunctiva/sclera: Conjunctivae normal.  Cardiovascular:     Rate and Rhythm: Normal rate and regular rhythm.     Heart sounds: Normal heart sounds.  Pulmonary:     Effort: Pulmonary effort is normal.     Breath sounds: Normal breath sounds.  Abdominal:     General: Bowel sounds are normal. There is no distension.     Palpations: Abdomen is soft.     Tenderness: There is no abdominal tenderness. There is no guarding.  Musculoskeletal:        General: Normal range of motion.     Cervical back: Normal range of motion and neck  supple.  Skin:    General: Skin is warm and dry.  Neurological:     Mental Status: She is alert and oriented to person, place, and time.  Psychiatric:        Mood and Affect: Mood normal.        Thought Content: Thought content normal.        Judgment: Judgment normal.      UC Treatments / Results  Labs (all labs ordered are listed, but only abnormal results are displayed) Labs Reviewed  SARS CORONAVIRUS 2 (TAT 6-24 HRS)    EKG   Radiology No results found.  Procedures Procedures (including critical care time)  Medications Ordered in UC Medications - No data to display  Initial Impression / Assessment and Plan / UC Course  I have reviewed the triage vital signs and the nursing notes.  Pertinent labs & imaging results that were available during my care of the patient were reviewed by me and considered in my medical decision making (see chart for details).     COVID pcr pending, sxs very mild and resolving. She declines prescription medications for sxs today and wishes to continue hot teas. Work note given until test results can return.   Final Clinical Impressions(s) / UC Diagnoses   Final diagnoses:  Pharyngitis, unspecified etiology   Discharge Instructions   None    ED Prescriptions    None     PDMP not reviewed this encounter.   Merrie Roof Jamul, [place] 12/23/19 [id number]

## 2019-12-31 ENCOUNTER — Other Ambulatory Visit (HOSPITAL_COMMUNITY)
Admission: RE | Admit: 2019-12-31 | Discharge: 2019-12-31 | Disposition: A | Discharge status: Home / Self Care | Payer: BC Managed Care – PPO | Source: Ambulatory Visit | Attending: Certified Nurse Midwife | Admitting: Certified Nurse Midwife

## 2019-12-31 ENCOUNTER — Encounter: Payer: BC Managed Care – PPO | Admitting: Certified Nurse Midwife

## 2019-12-31 ENCOUNTER — Other Ambulatory Visit: Payer: Self-pay

## 2019-12-31 ENCOUNTER — Ambulatory Visit (INDEPENDENT_AMBULATORY_CARE_PROVIDER_SITE_OTHER): Payer: BC Managed Care – PPO | Admitting: Certified Nurse Midwife

## 2019-12-31 ENCOUNTER — Encounter: Payer: Self-pay | Admitting: Certified Nurse Midwife

## 2019-12-31 VITALS — BP 105/71 | HR 79 | Ht 65.0 in | Wt 178.9 lb

## 2019-12-31 DIAGNOSIS — Z113 Encounter for screening for infections with a predominantly sexual mode of transmission: Secondary | ICD-10-CM

## 2019-12-31 DIAGNOSIS — Z01419 Encounter for gynecological examination (general) (routine) without abnormal findings: Secondary | ICD-10-CM | POA: Diagnosis not present

## 2019-12-31 DIAGNOSIS — Z8742 Personal history of other diseases of the female genital tract: Secondary | ICD-10-CM | POA: Diagnosis present

## 2019-12-31 DIAGNOSIS — Z975 Presence of (intrauterine) contraceptive device: Secondary | ICD-10-CM | POA: Insufficient documentation

## 2019-12-31 DIAGNOSIS — N6312 Unspecified lump in the right breast, upper inner quadrant: Secondary | ICD-10-CM

## 2019-12-31 NOTE — Patient Instructions (Signed)
Metronidazole vaginal gel What is this medicine? METRONIDAZOLE (me troe NI da zole) VAGINAL GEL is an antiinfective. It is used to treat bacterial vaginitis. This medicine may be used for other purposes; ask your health care provider or pharmacist if you have questions. COMMON BRAND NAME(S): MetroGel, MetroGel Vaginal, MetroGel-Vaginal, NUVESSA, Vandazole What should I tell my health care provider before I take this medicine? They need to know if you have any of these conditions:  Cockayne syndrome  history of blood diseases, like sickle cell disease or leukemia  history of yeast infection  if you often drink alcohol  liver disease  an unusual or allergic reaction to metronidazole, nitroimidazoles, parabens, or other medicines, foods, dyes, or preservatives  pregnant or trying to get pregnant  breast-feeding How should I use this medicine? This medicine is only for use in the vagina. Do not take by mouth or apply to other areas of the body. Follow the directions on the prescription label. Wash hands before and after use. Screw the applicator to the tube and squeeze the tube gently to fill the applicator. Lie on your back, part and bend your knees. Insert the applicator tip high in the vagina and push the plunger to release the gel into the vagina. Gently remove the applicator. Wash the applicator well with warm water and soap. Use at regular intervals. Finish the full course prescribed by your doctor or health care professional even if you think your condition is better. Do not stop using except on the advice of your doctor or health care professional. Talk to your pediatrician regarding the use of this medicine in children. While this drug maybe prescribed for children as young as 12 years for selected conditions, precautions do apply. Overdosage: If you think you have taken too much of this medicine contact a poison control center or emergency room at once. NOTE: This medicine is only  for you. Do not share this medicine with others. What if I miss a dose? If you miss a dose, use it as soon as you can. If it is almost time for your next dose, use only that dose. Do not use double or extra doses. What may interact with this medicine? Do not take this medicine with any of the following medications:  alcohol or any product that contains alcohol  cisapride  disulfiram  dronedarone  pimozide  thioridazine This medicine may also interact with the following medications:  amiodarone  birth control pills  busulfan  carbamazepine  cimetidine  cyclosporine  fluorouracil  lithium  other medicines that prolong the QT interval (cause an abnormal heart rhythm) like dofetilide, ziprasidone  phenobarbital  phenytoin  quinidine  tacrolimus  vecuronium  warfarin This list may not describe all possible interactions. Give your health care provider a list of all the medicines, herbs, non-prescription drugs, or dietary supplements you use. Also tell them if you smoke, drink alcohol, or use illegal drugs. Some items may interact with your medicine. What should I watch for while using this medicine? Tell your doctor or health care professional if your symptoms do not improve or if they get worse. You may get drowsy or dizzy. Do not drive, use machinery, or do anything that needs mental alertness until you know how this medicine affects you. Do not stand or sit up quickly, especially if you are an older patient. This reduces the risk of dizzy or fainting spells. Ask your doctor or health care professional if you should avoid alcohol. Many nonprescription cough and  cold products contain alcohol. Metronidazole can cause an unpleasant reaction when taken with alcohol. The reaction includes flushing, headache, nausea, vomiting, sweating, and increased thirst. The reaction can last from 30 minutes to several hours. If you are being treated for a sexually transmitted disease,  avoid sexual contact until you have finished your treatment. Your sexual partner may also need treatment. What side effects may I notice from receiving this medicine? Side effects that you should report to your doctor or health care professional as soon as possible:  allergic reactions like skin rash, itching or hives, swelling of the face, lips, or tongue  confusion  fast, irregular heartbeat  fever, chills, sore throat  fever with rash, swollen lymph nodes, or swelling of the face  pain, tingling, numbness in the hands or feet  redness, blistering, peeling or loosening of the skin, including inside the mouth  seizures  sign and symptoms of liver injury like dark yellow or brown urine; general ill feeling or flu-like symptoms; light colored stools; loss of appetite; nausea; right upper belly pain; unusually weak or tired; yellowing of the eyes or skin  vaginal discharge, itching, or odor in women Side effects that usually do not require medical attention (report to your doctor or health care professional if they continue or are bothersome):  changes in taste  diarrhea  headache  nausea, vomiting  stomach pain This list may not describe all possible side effects. Call your doctor for medical advice about side effects. You may report side effects to FDA at 1-800-FDA-1088. Where should I keep my medicine? Keep out of the reach of children. Store at room temperature between 15 and 30 degrees C (59 and 86 degrees F). Do not freeze. Throw away any unused medicine after the expiration date. NOTE: This sheet is a summary. It may not cover all possible information. If you have questions about this medicine, talk to your doctor, pharmacist, or health care provider.  2020 Elsevier/Gold Standard (2017-12-23 06:53:27)   Levonorgestrel intrauterine device (IUD) What is this medicine? LEVONORGESTREL IUD (LEE voe nor jes trel) is a contraceptive (birth control) device. The device is  placed inside the uterus by a healthcare professional. It is used to prevent pregnancy. This device can also be used to treat heavy bleeding that occurs during your period. This medicine may be used for other purposes; ask your health care provider or pharmacist if you have questions. COMMON BRAND NAME(S): Minette Headland What should I tell my health care provider before I take this medicine? They need to know if you have any of these conditions:  abnormal Pap smear  cancer of the breast, uterus, or cervix  diabetes  endometritis  genital or pelvic infection now or in the past  have more than one sexual partner or your partner has more than one partner  heart disease  history of an ectopic or tubal pregnancy  immune system problems  IUD in place  liver disease or tumor  problems with blood clots or take blood-thinners  seizures  use intravenous drugs  uterus of unusual shape  vaginal bleeding that has not been explained  an unusual or allergic reaction to levonorgestrel, other hormones, silicone, or polyethylene, medicines, foods, dyes, or preservatives  pregnant or trying to get pregnant  breast-feeding How should I use this medicine? This device is placed inside the uterus by a health care professional. Talk to your pediatrician regarding the use of this medicine in children. Special care may be needed. Overdosage:  If you think you have taken too much of this medicine contact a poison control center or emergency room at once. NOTE: This medicine is only for you. Do not share this medicine with others. What if I miss a dose? This does not apply. Depending on the brand of device you have inserted, the device will need to be replaced every 3 to 6 years if you wish to continue using this type of birth control. What may interact with this medicine? Do not take this medicine with any of the following  medications:  amprenavir  bosentan  fosamprenavir This medicine may also interact with the following medications:  aprepitant  armodafinil  barbiturate medicines for inducing sleep or treating seizures  bexarotene  boceprevir  griseofulvin  medicines to treat seizures like carbamazepine, ethotoin, felbamate, oxcarbazepine, phenytoin, topiramate  modafinil  pioglitazone  rifabutin  rifampin  rifapentine  some medicines to treat HIV infection like atazanavir, efavirenz, indinavir, lopinavir, nelfinavir, tipranavir, ritonavir  St. John's wort  warfarin This list may not describe all possible interactions. Give your health care provider a list of all the medicines, herbs, non-prescription drugs, or dietary supplements you use. Also tell them if you smoke, drink alcohol, or use illegal drugs. Some items may interact with your medicine. What should I watch for while using this medicine? Visit your doctor or health care professional for regular check ups. See your doctor if you or your partner has sexual contact with others, becomes HIV positive, or gets a sexual transmitted disease. This product does not protect you against HIV infection (AIDS) or other sexually transmitted diseases. You can check the placement of the IUD yourself by reaching up to the top of your vagina with clean fingers to feel the threads. Do not pull on the threads. It is a good habit to check placement after each menstrual period. Call your doctor right away if you feel more of the IUD than just the threads or if you cannot feel the threads at all. The IUD may come out by itself. You may become pregnant if the device comes out. If you notice that the IUD has come out use a backup birth control method like condoms and call your health care provider. Using tampons will not change the position of the IUD and are okay to use during your period. This IUD can be safely scanned with magnetic resonance imaging  (MRI) only under specific conditions. Before you have an MRI, tell your healthcare provider that you have an IUD in place, and which type of IUD you have in place. What side effects may I notice from receiving this medicine? Side effects that you should report to your doctor or health care professional as soon as possible:  allergic reactions like skin rash, itching or hives, swelling of the face, lips, or tongue  fever, flu-like symptoms  genital sores  high blood pressure  no menstrual period for 6 weeks during use  pain, swelling, warmth in the leg  pelvic pain or tenderness  severe or sudden headache  signs of pregnancy  stomach cramping  sudden shortness of breath  trouble with balance, talking, or walking  unusual vaginal bleeding, discharge  yellowing of the eyes or skin Side effects that usually do not require medical attention (report to your doctor or health care professional if they continue or are bothersome):  acne  breast pain  change in sex drive or performance  changes in weight  cramping, dizziness, or faintness while the device is  being inserted  headache  irregular menstrual bleeding within first 3 to 6 months of use  nausea This list may not describe all possible side effects. Call your doctor for medical advice about side effects. You may report side effects to FDA at 1-800-FDA-1088. Where should I keep my medicine? This does not apply. NOTE: This sheet is a summary. It may not cover all possible information. If you have questions about this medicine, talk to your doctor, pharmacist, or health care provider.  2020 Elsevier/Gold Standard (2017-11-11 13:22:01)

## 2019-12-31 NOTE — Progress Notes (Signed)
ANNUAL PREVENTATIVE CARE GYN  ENCOUNTER NOTE  Subjective:       Briana Shepherd is a 29 y.o. G0P0000 female here for a routine annual gynecologic exam.  Current complaints: 1. History of vaginitis-currently using Metrogel 2. Request STI screening  Doing well since IUD placement, no bleeding or other concerning side effects.   Denies difficult breathing or respiratory distress, abdominal pain, constipation, chest pain, or leg swelling.   Gynecologic History  No LMP recorded. (Menstrual status: IUD).   Contraception: IUD, [name]  Last Pap 12/15/2017. Results were: normal  Obstetric History  OB History  Gravida Para Term Preterm AB Living  0 0 0 0 0 0  SAB IAB Ectopic Multiple Live Births  0 0 0 0 0    Past Medical History:  Diagnosis Date  . No pertinent past medical history     Past Surgical History:  Procedure Laterality Date  . NO PAST SURGERIES      Current Outpatient Medications on File Prior to Visit  Medication Sig Dispense Refill  . Levonorgestrel 13.5 MG IUD by Intrauterine route. Inserted 11/22/2019 by JML.    Marland Kitchen metroNIDAZOLE (METROGEL) 0.75 % vaginal gel Place 1 Applicatorful vaginally at bedtime. Apply one applicatorful to vagina at bedtime for 10 days, then twice a week for 6 months. 70 g 5  . Multiple Vitamin (MULTI-DAY VITAMINS PO) Take by mouth.     No current facility-administered medications on file prior to visit.    No Known Allergies  Social History   Socioeconomic History  . Marital status: Single    Spouse name: Not on file  . Number of children: Not on file  . Years of education: Not on file  . Highest education level: Not on file  Occupational History  . Not on file  Tobacco Use  . Smoking status: Never Smoker  . Smokeless tobacco: Never Used  Vaping Use  . Vaping Use: Never used  Substance and Sexual Activity  . Alcohol use: Yes    Comment: occasional   . Drug use: No  . Sexual activity: Yes    Birth control/protection:  I.U.D.  Other Topics Concern  . Not on file  Social History Narrative  . Not on file   Social Determinants of Health   Financial Resource Strain: Not on file  Food Insecurity: Not on file  Transportation Needs: Not on file  Physical Activity: Not on file  Stress: Not on file  Social Connections: Not on file  Intimate Partner Violence: Not on file    Family History  Problem Relation Age of Onset  . Diabetes Maternal Grandmother   . Stroke Maternal Grandmother   . HIV/AIDS Mother   . Breast cancer Neg Hx   . Ovarian cancer Neg Hx   . Colon cancer Neg Hx     The following portions of the patient's history were reviewed and updated as appropriate: allergies, current medications, past family history, past medical history, past social history, past surgical history and problem list.  Review of Systems  ROS: Negative except as noted above. Information obtained from patient.    Objective:   BP 105/71   Pulse 79   Ht 5\' 5"  (1.651 m)   Wt 81.1 kg   BMI 29.77 kg/m    CONSTITUTIONAL: Well-developed, well-nourished female in no acute distress.   PSYCHIATRIC: Normal mood and affect. Normal behavior. Normal judgment and thought content.  Merlin: Alert and oriented to person, place, and time. Normal muscle tone coordination. No  cranial nerve deficit noted.  EYES: Conjunctivae and EOM are normal.   NECK: Normal range of motion, supple, no masses.  Normal thyroid.   SKIN: Skin is warm and dry. No rash noted. Not diaphoretic. No erythema. No pallor.  CARDIOVASCULAR: Normal heart rate noted, regular rhythm, no murmur.  RESPIRATORY: Clear to auscultation bilaterally. Effort and breath sounds normal, no problems with respiration noted.  BREASTS: Symmetric in size. Left breast, no masses, skin changes, nipple drainage, or lymphadenopathy. Right Breast: dime sized round mobile lump present at 2 o'clock position, no changes since last exam.  ABDOMEN: Soft, normal bowel sounds,  no distention noted.  No tenderness, rebound or guarding.   PELVIC:  External Genitalia: Normal  Vagina: Normal  Cervix: Normal; IUD strings visualized 3 cm  Uterus: Normal  Adnexa: Normal  MUSCULOSKELETAL: Normal range of motion. No tenderness.  No cyanosis, clubbing, or edema.  2+ distal pulses.  Assessment:   Annual gynecologic examination 29 y.o.   Contraception: IUD   Overweight   Problem List Items Addressed This Visit      Other   IUD (intrauterine device) in place   Relevant Orders   Cervicovaginal ancillary only    Other Visit Diagnoses    Encounter for well woman exam with routine gynecological exam    -  Primary   Relevant Orders   Cervicovaginal ancillary only   History of vaginitis       Relevant Orders   Cervicovaginal ancillary only   Routine screening for STI (sexually transmitted infection)       Relevant Orders   Cervicovaginal ancillary only      Plan:   Pap: Not needed   Labs: See orders  Routine preventative health maintenance measures emphasized: Exercise/Diet/Weight control, Tobacco Warnings, Alcohol/Substance use risks, Stress Management, Peer Pressure Issues and Safe Sex; see AVS  Reviewed red flag symptoms and when to call  RTC x 1 year for ANNUAL EXAM with JML or sooner if needed   Daneil Dan, [place] 12/31/19 4:55 PM

## 2019-12-31 NOTE — Progress Notes (Signed)
I have seen, interviewed, and examined the patient in conjunction with the [facility] and affirm the diagnosis and management plan.   Diona Fanti, CNM [facility], [facility] 12/31/19 5:13 PM

## 2019-12-31 NOTE — Progress Notes (Signed)
Pt present for annual exam. Declines flu vaccine.

## 2020-01-04 LAB — CERVICOVAGINAL ANCILLARY ONLY
Bacterial Vaginitis (gardnerella): NEGATIVE
Candida Glabrata: NEGATIVE
Candida Vaginitis: NEGATIVE
Chlamydia: NEGATIVE
Comment: NEGATIVE
Comment: NEGATIVE
Comment: NEGATIVE
Comment: NEGATIVE
Comment: NEGATIVE
Comment: NORMAL
Neisseria Gonorrhea: NEGATIVE
Trichomonas: NEGATIVE

## 2020-05-01 ENCOUNTER — Other Ambulatory Visit: Payer: Self-pay | Admitting: Surgical

## 2020-05-01 DIAGNOSIS — N6312 Unspecified lump in the right breast, upper inner quadrant: Secondary | ICD-10-CM

## 2020-05-05 ENCOUNTER — Ambulatory Visit
Admission: RE | Admit: 2020-05-05 | Discharge: 2020-05-05 | Disposition: A | Discharge status: Home / Self Care | Payer: BC Managed Care – PPO | Source: Ambulatory Visit | Attending: Obstetrics and Gynecology | Admitting: Obstetrics and Gynecology

## 2020-05-05 ENCOUNTER — Other Ambulatory Visit: Payer: Self-pay

## 2020-05-05 DIAGNOSIS — N6312 Unspecified lump in the right breast, upper inner quadrant: Secondary | ICD-10-CM | POA: Insufficient documentation

## 2020-05-08 ENCOUNTER — Other Ambulatory Visit: Payer: Self-pay | Admitting: Obstetrics and Gynecology

## 2020-05-08 DIAGNOSIS — N6310 Unspecified lump in the right breast, unspecified quadrant: Secondary | ICD-10-CM

## 2020-05-08 DIAGNOSIS — R928 Other abnormal and inconclusive findings on diagnostic imaging of breast: Secondary | ICD-10-CM

## 2020-05-10 ENCOUNTER — Other Ambulatory Visit: Payer: Self-pay | Admitting: Certified Nurse Midwife

## 2020-05-10 DIAGNOSIS — N6310 Unspecified lump in the right breast, unspecified quadrant: Secondary | ICD-10-CM

## 2020-05-10 DIAGNOSIS — R928 Other abnormal and inconclusive findings on diagnostic imaging of breast: Secondary | ICD-10-CM

## 2020-05-16 ENCOUNTER — Ambulatory Visit
Admission: RE | Admit: 2020-05-16 | Discharge: 2020-05-16 | Disposition: A | Discharge status: Home / Self Care | Payer: BC Managed Care – PPO | Source: Ambulatory Visit | Attending: Certified Nurse Midwife | Admitting: Certified Nurse Midwife

## 2020-05-16 ENCOUNTER — Other Ambulatory Visit: Payer: Self-pay

## 2020-05-16 DIAGNOSIS — N6310 Unspecified lump in the right breast, unspecified quadrant: Secondary | ICD-10-CM | POA: Insufficient documentation

## 2020-05-16 DIAGNOSIS — R928 Other abnormal and inconclusive findings on diagnostic imaging of breast: Secondary | ICD-10-CM | POA: Insufficient documentation

## 2020-05-16 HISTORY — PX: BREAST BIOPSY: SHX20

## 2020-05-17 LAB — SURGICAL PATHOLOGY

## 2020-06-12 ENCOUNTER — Telehealth: Payer: Self-pay | Admitting: Certified Nurse Midwife

## 2020-06-12 ENCOUNTER — Other Ambulatory Visit: Payer: Self-pay | Admitting: Certified Nurse Midwife

## 2020-06-12 DIAGNOSIS — E559 Vitamin D deficiency, unspecified: Secondary | ICD-10-CM

## 2020-06-12 DIAGNOSIS — N6312 Unspecified lump in the right breast, upper inner quadrant: Secondary | ICD-10-CM

## 2020-06-12 DIAGNOSIS — D241 Benign neoplasm of right breast: Secondary | ICD-10-CM

## 2020-06-12 MED ORDER — VITAMIN D (ERGOCALCIFEROL) 1.25 MG (50000 UNIT) PO CAPS: 50000.0000 [IU] | ORAL_CAPSULE | ORAL | 1 refills | Status: DC

## 2020-06-12 NOTE — Telephone Encounter (Signed)
Telephone call to patient, verified full name and date birth.   Breast ultrasound and mammogram results discussed with patient. Patient agrees to watchful waiting instead of surgical removal at this time.   Reports visit with PCP and vitamin D deficiency identified. Rx Vitamin D, see orders.   Reviewed red flag symptoms and when to call. RTC as previously as scheduled or sooner if needed.    Dani Gobble, CNM [facility], [facility] 06/12/20 6:36 PM

## 2020-06-12 NOTE — Progress Notes (Signed)
Rx Vitamin, see orders.    Dani Gobble, CNM [facility], [facility] 06/12/20 6:31 PM

## 2020-08-11 ENCOUNTER — Other Ambulatory Visit: Payer: Self-pay

## 2020-08-11 ENCOUNTER — Ambulatory Visit (INDEPENDENT_AMBULATORY_CARE_PROVIDER_SITE_OTHER): Payer: Self-pay

## 2020-08-11 DIAGNOSIS — Z113 Encounter for screening for infections with a predominantly sexual mode of transmission: Secondary | ICD-10-CM

## 2020-08-11 NOTE — Progress Notes (Signed)
Pt present to have std screening. Pt completed self swab. Pt declined any std symptoms and declined HIV and Hept screenings.

## 2020-08-21 ENCOUNTER — Telehealth: Payer: Self-pay | Admitting: Certified Nurse Midwife

## 2020-08-21 NOTE — Telephone Encounter (Signed)
Patient returned call to the office. Pt was informed that she needed to redo her self swab. Pt stated that she understood and would be by the office after 2pm today.

## 2020-08-21 NOTE — Telephone Encounter (Signed)
Called pt to see if she could come back into the office redo her self-swab.

## 2020-08-21 NOTE — Telephone Encounter (Signed)
Pt called requesting her lab results. Please advise

## 2020-08-23 ENCOUNTER — Other Ambulatory Visit: Payer: Self-pay

## 2020-08-23 DIAGNOSIS — Z113 Encounter for screening for infections with a predominantly sexual mode of transmission: Secondary | ICD-10-CM

## 2020-08-24 LAB — HIV ANTIBODY (ROUTINE TESTING W REFLEX): HIV Screen 4th Generation wRfx: NONREACTIVE

## 2020-08-24 LAB — HEPATITIS C ANTIBODY: Hep C Virus Ab: <0.1 s/co ratio (ref 0.0–0.9)

## 2020-08-24 LAB — HEPATITIS B SURFACE ANTIGEN: Hepatitis B Surface Ag: NEGATIVE

## 2020-08-27 LAB — NUSWAB VAGINITIS PLUS (VG+)
Atopobium vaginae: HIGH Score — AB
BVAB 2: HIGH Score — AB
Candida albicans, NAA: NEGATIVE
Candida glabrata, NAA: NEGATIVE
Chlamydia trachomatis, NAA: NEGATIVE
Megasphaera 1: HIGH Score — AB
Neisseria gonorrhoeae, NAA: NEGATIVE
Trich vag by NAA: NEGATIVE

## 2020-08-30 ENCOUNTER — Other Ambulatory Visit: Payer: Self-pay

## 2020-08-30 MED ORDER — METRONIDAZOLE 500 MG PO TABS: 500.0000 mg | ORAL_TABLET | Freq: Two times a day (BID) | ORAL | 0 refills | Status: DC

## 2020-11-20 ENCOUNTER — Ambulatory Visit
Admission: RE | Admit: 2020-11-20 | Discharge: 2020-11-20 | Disposition: A | Discharge status: Home / Self Care | Payer: BC Managed Care – PPO | Source: Ambulatory Visit | Attending: Emergency Medicine | Admitting: Emergency Medicine

## 2020-11-20 ENCOUNTER — Other Ambulatory Visit: Payer: Self-pay

## 2020-11-20 VITALS — BP 121/81 | HR 109 | Temp 99.5°F | Resp 18

## 2020-11-20 DIAGNOSIS — B9689 Other specified bacterial agents as the cause of diseases classified elsewhere: Secondary | ICD-10-CM

## 2020-11-20 DIAGNOSIS — R000 Tachycardia, unspecified: Secondary | ICD-10-CM | POA: Insufficient documentation

## 2020-11-20 DIAGNOSIS — J029 Acute pharyngitis, unspecified: Secondary | ICD-10-CM | POA: Insufficient documentation

## 2020-11-20 DIAGNOSIS — J028 Acute pharyngitis due to other specified organisms: Secondary | ICD-10-CM | POA: Diagnosis not present

## 2020-11-20 DIAGNOSIS — R519 Headache, unspecified: Secondary | ICD-10-CM | POA: Diagnosis present

## 2020-11-20 LAB — POCT INFLUENZA A/B
Influenza A, POC: NEGATIVE
Influenza B, POC: NEGATIVE

## 2020-11-20 LAB — POCT RAPID STREP A (OFFICE): Rapid Strep A Screen: NEGATIVE

## 2020-11-20 MED ORDER — CEFDINIR 300 MG PO CAPS: 300.0000 mg | ORAL_CAPSULE | Freq: Two times a day (BID) | ORAL | 0 refills | Status: DC

## 2020-11-20 NOTE — ED Provider Notes (Signed)
[facility]    CSN: [id number] Arrival date & time: 11/20/20  0801      History   Chief Complaint Chief Complaint  Patient presents with   Sore Throat    HPI Briana Shepherd is a 30 y.o. female.   Patient presents to [facility] for assessment of sore throat starting Saturday morning, Sunday started having headaches, and then today began to have a sore throat, headache, runny nose, cough.  Denies body aches, fever, n/v/d.  Patient states she has been around her nephew who has had a lot of different viruses recently.    The history is provided by the patient.   History reviewed. No pertinent past medical history.  Patient Active Problem List   Diagnosis Date Noted   Breast lump on right side at 2 o'clock position 12/31/2019   Encounter for removal and reinsertion of IUD 11/22/2019   IUD (intrauterine device) in place 03/18/2017    Past Surgical History:  Procedure Laterality Date   BREAST BIOPSY Right 05/16/2020   [place] BX, Q-clip, path pending    NO PAST SURGERIES      OB History     Gravida  0   Para  0   Term  0   Preterm  0   AB  0   Living  0      SAB  0   IAB  0   Ectopic  0   Multiple  0   Live Births  0            Home Medications    Prior to Admission medications   Medication Sig Start Date End Date Taking? Authorizing Provider  cefdinir (OMNICEF) 300 MG capsule Take 1 capsule (300 mg total) by mouth 2 (two) times daily for 10 days. 11/20/20 11/30/20 Yes Lynden Oxford Scales, PA-C  Levonorgestrel 13.5 MG IUD by Intrauterine route. Inserted 11/22/2019 by JML.    [provider]  Vitamin D, Ergocalciferol, (DRISDOL) 1.25 MG (50000 UNIT) CAPS capsule Take 1 capsule (50,000 Units total) by mouth every 7 (seven) days. 06/12/20   Lawhorn, Lara Mulch, CNM    Family History Family History  Problem Relation Age of Onset   Diabetes Maternal Grandmother    Stroke Maternal Grandmother    HIV/AIDS Mother    Breast cancer  Neg Hx    Ovarian cancer Neg Hx    Colon cancer Neg Hx     Social History Social History   Tobacco Use   Smoking status: Never   Smokeless tobacco: Never  Vaping Use   Vaping Use: Never used  Substance Use Topics   Alcohol use: Yes    Comment: occasional    Drug use: No     Allergies   Patient has no known allergies.   Review of Systems Review of Systems Pertinent findings noted in history of present illness.    Physical Exam Triage Vital Signs ED Triage Vitals  Enc Vitals Group     BP 11/10/20 0827 (!) 147/82     Pulse Rate 11/10/20 0827 72     Resp 11/10/20 0827 18     Temp 11/10/20 0827 98.3 F (36.8 C)     Temp Source 11/10/20 0827 Oral     SpO2 11/10/20 0827 98 %     Weight --      Height --      Head Circumference --      Peak Flow --      Pain Score  11/10/20 0826 5     Pain Loc --      Pain Edu? --      Excl. in [place]? --    No data found.  Updated Vital Signs BP 121/81 (BP Location: Left Arm)   Pulse (!) 109   Temp 99.5 F (37.5 C) (Oral)   Resp 18   SpO2 96%   Visual Acuity Right Eye Distance:   Left Eye Distance:   Bilateral Distance:    Right Eye Near:   Left Eye Near:    Bilateral Near:     Physical Exam Vitals and nursing note reviewed.  Constitutional:      General: She is not in acute distress.    Appearance: Normal appearance. She is not ill-appearing.  HENT:     Head: Normocephalic and atraumatic.     Salivary Glands: Right salivary gland is not diffusely enlarged or tender. Left salivary gland is not diffusely enlarged or tender.     Right Ear: External ear normal. No drainage or tenderness. A middle ear effusion is present. There is no impacted cerumen. Tympanic membrane is bulging. Tympanic membrane is not erythematous.     Left Ear: External ear normal. No drainage or tenderness. A middle ear effusion is present. There is no impacted cerumen. Tympanic membrane is bulging. Tympanic membrane is not erythematous.     Ears:      Comments: Both EACs diffusely erythematous, both TMs bulging with serous fluid    Nose: Nose normal. No nasal deformity, septal deviation, mucosal edema, congestion or rhinorrhea.     Right Turbinates: Not enlarged, swollen or pale.     Left Turbinates: Not enlarged, swollen or pale.     Right Sinus: No maxillary sinus tenderness or frontal sinus tenderness.     Left Sinus: No maxillary sinus tenderness or frontal sinus tenderness.     Mouth/Throat:     Lips: Pink. No lesions.     Mouth: Mucous membranes are moist. No oral lesions.     Pharynx: Uvula midline. Pharyngeal swelling, posterior oropharyngeal erythema and uvula swelling present.     Tonsils: Tonsillar exudate present. No tonsillar abscesses. 3+ on the right. 3+ on the left.  Eyes:     General: Lids are normal.        Right eye: No discharge.        Left eye: No discharge.     Extraocular Movements: Extraocular movements intact.     Right eye: Normal extraocular motion.     Left eye: Normal extraocular motion.     Conjunctiva/sclera: Conjunctivae normal.     Right eye: Right conjunctiva is not injected.     Left eye: Left conjunctiva is not injected.     Pupils: Pupils are equal, round, and reactive to light.  Neck:     Trachea: Trachea and phonation normal.  Cardiovascular:     Rate and Rhythm: Normal rate and regular rhythm.     Pulses: Normal pulses.     Heart sounds: Normal heart sounds. No murmur heard.   No friction rub. No gallop.  Pulmonary:     Effort: Pulmonary effort is normal. No accessory muscle usage, prolonged expiration or respiratory distress.     Breath sounds: Normal breath sounds. No stridor, decreased air movement or transmitted upper airway sounds. No decreased breath sounds, wheezing, rhonchi or rales.  Chest:     Chest wall: No tenderness.  Abdominal:     Tenderness: There is generalized abdominal tenderness.  Musculoskeletal:        General: Normal range of motion.     Cervical back:  Normal range of motion and neck supple. Normal range of motion.  Lymphadenopathy:     Cervical: No cervical adenopathy.  Skin:    General: Skin is warm and dry.     Findings: No erythema or rash.  Neurological:     General: No focal deficit present.     Mental Status: She is alert and oriented to person, place, and time.  Psychiatric:        Mood and Affect: Mood normal.        Behavior: Behavior normal.     UC Treatments / Results  Labs (all labs ordered are listed, but only abnormal results are displayed) Labs Reviewed  CULTURE, GROUP A STREP [facility])  POCT INFLUENZA A/B  POCT RAPID STREP A (OFFICE)    EKG   Radiology No results found.  Procedures Procedures (including critical care time)  Medications Ordered in UC Medications - No data to display  Initial Impression / Assessment and Plan / UC Course  I have reviewed the triage vital signs and the nursing notes.  Pertinent labs & imaging results that were available during my care of the patient were reviewed by me and considered in my medical decision making (see chart for details).     Influenza test today is negative.  Patient was tested for streptococcal pharyngitis, throat culture will be performed per protocol.  Patient prescribed cefdinir and advised to continue taking should she have significant improvement of her symptoms regardless of results of throat culture.  Return precautions advised.  Patient verbalized understanding and agreement of plan as discussed.  All questions were addressed during visit.  Please see discharge instructions below for further details of plan.  Final Clinical Impressions(s) / UC Diagnoses   Final diagnoses:  Acute nonintractable headache, unspecified headache type  Acute pharyngitis, unspecified etiology  Tachycardia  Bacterial pharyngitis     Discharge Instructions      Based on my physical exam today and the history that you provided, I believe that you have bacterial  pharyngitis.  Bacterial pharyngitis is most commonly caused by bacteria called group A Streptococcus but there are many other culprits.  Your influenza test today is negative.  The rapid strep test that we performed in the office only catches 40% of cases of group A streptococcal pharyngitis.  Additionally, and unfortunately, the throat culture that we perform here in the urgent care only evaluates for group A strep and not any of the other bacteria  that are known to cause bacterial pharyngitis.  Your rapid strep test today is negative.  I have prescribed you an antibiotic called cefdinir to treat you for bacterial pharyngitis.  Please take 1 tablet twice daily for the next 10 days.  Once you have been on cefdinir for a full 24 hours, you are no longer considered contagious.  If you receive a phone call telling you that your throat culture is negative, please keep in mind that it is only negative for group A strep and that they did not test for any other bacteria that cause bacterial pharyngitis.  For this reason, I strongly recommend that you complete the entire 10 days of antibiotics regardless of the result of a throat culture, particularly if you begin to feel better within the first 48 hours of therapy.  Please follow-up with your primary care provider in the next week to 10 days for  repeat evaluation.  Please follow-up within the next 3 to 5 days either with your primary care provider or urgent care if your symptoms do not resolve.  If you do not have a primary care provider, we will assist you in finding one.     ED Prescriptions     Medication Sig Dispense Auth. Provider   cefdinir (OMNICEF) 300 MG capsule Take 1 capsule (300 mg total) by mouth 2 (two) times daily for 10 days. 20 capsule Lynden Oxford Scales, PA-C      PDMP not reviewed this encounter.   Disposition Upon Discharge:   The patient will follow up with their current PCP if and as advised, If the patient does not  currently have a PCP we will assist them in obtaining one.   Routine symptom specific, illness specific and/or disease specific instructions were discussed with the patient and/or caregiver at length; Patient presented with an acute illness with associated systemic symptoms and significant discomfort requiring acute urgent management. In my opinion, this is a condition that a prudent lay person (someone who possesses an average knowledge of health and medicine) may potentially expect to result in serious jeopardy, cause serious impairment of bodily function or result in serious dysfunction of bodily organs. As such, the patient has been evaluated and assessed; workup and treatment have begun; but the patient may require follow up for further testing and treatment if the symptoms continue in spite of treatment, as clinically indicated and appropriate.  Return to the [facility] or PCP in 3-5 days if no better; to PCP or the Emergency Department if new signs and symptoms develop, or if the current signs or symptoms continue to change or worsen for further workup, evaluation and treatment as clinically indicated and appropriate  Condition: stable for discharge home Home: take medications as prescribed; routine discharge instructions as discussed; follow up as advised.    Lynden Oxford Scales, PA-C 11/20/20 2100

## 2020-11-20 NOTE — ED Triage Notes (Signed)
Patient presents to [facility] for assessment of sore throat starting Saturday morning, Sunday started having headaches, and then today has sore throat, headache, runny nose, cough.  Denies body aches, fever, n/v/d.  Has been around her nephew who has had a lot of different viruses recently.

## 2020-11-20 NOTE — Discharge Instructions (Addendum)
Based on my physical exam today and the history that you provided, I believe that you have bacterial pharyngitis.  Bacterial pharyngitis is most commonly caused by bacteria called group A Streptococcus but there are many other culprits.  Your influenza test today is negative.  The rapid strep test that we performed in the office only catches 40% of cases of group A streptococcal pharyngitis.  Additionally, and unfortunately, the throat culture that we perform here in the urgent care only evaluates for group A strep and not any of the other bacteria  that are known to cause bacterial pharyngitis.  Your rapid strep test today is negative.  I have prescribed you an antibiotic called cefdinir to treat you for bacterial pharyngitis.  Please take 1 tablet twice daily for the next 10 days.  Once you have been on cefdinir for a full 24 hours, you are no longer considered contagious.  If you receive a phone call telling you that your throat culture is negative, please keep in mind that it is only negative for group A strep and that they did not test for any other bacteria that cause bacterial pharyngitis.  For this reason, I strongly recommend that you complete the entire 10 days of antibiotics regardless of the result of a throat culture, particularly if you begin to feel better within the first 48 hours of therapy.  Please follow-up with your primary care provider in the next week to 10 days for repeat evaluation.  Please follow-up within the next 3 to 5 days either with your primary care provider or urgent care if your symptoms do not resolve.  If you do not have a primary care provider, we will assist you in finding one.

## 2020-11-22 ENCOUNTER — Other Ambulatory Visit: Payer: Self-pay

## 2020-11-22 ENCOUNTER — Ambulatory Visit
Admission: EM | Admit: 2020-11-22 | Discharge: 2020-11-22 | Disposition: A | Discharge status: Home / Self Care | Payer: BC Managed Care – PPO | Attending: Emergency Medicine | Admitting: Emergency Medicine

## 2020-11-22 DIAGNOSIS — J069 Acute upper respiratory infection, unspecified: Secondary | ICD-10-CM | POA: Diagnosis not present

## 2020-11-22 DIAGNOSIS — J029 Acute pharyngitis, unspecified: Secondary | ICD-10-CM

## 2020-11-22 DIAGNOSIS — R509 Fever, unspecified: Secondary | ICD-10-CM

## 2020-11-22 DIAGNOSIS — Z20828 Contact with and (suspected) exposure to other viral communicable diseases: Secondary | ICD-10-CM

## 2020-11-22 MED ORDER — IBUPROFEN 600 MG PO TABS
600.0000 mg | ORAL_TABLET | Freq: Once | ORAL | Status: AC
  Administered 2020-11-22: 600 mg via ORAL

## 2020-11-22 NOTE — Discharge Instructions (Addendum)
I am very sorry that the cefdinir was not the magic bullet to helping you feel better.  Unfortunately, this means that your infection is viral and not bacterial so you can stop the cefdinir now.  You were tested today for:  COVID Influenza RSV  Please continue to isolate at home until you receive the results of his/her/your tests.  The results of viral testing will be made available through your MyChart account as well as by phone, this usually takes 24 to 48 hours.  Keep in mind that it is recommend that he/she/you should self isolate for at least 3 days after your symptoms began AND he/she/you is/are fever free and symptom free without the use of fever reducing medications such as Tylenol and ibuprofen.  Also keep in mind is also important to self isolate within your household and to wear a mask around other people at home on days 4 through 10 after your symptoms began.  For more information, please read to the [facility] website regarding COVID, influenza and RSV isolation guidelines.    Your symptoms are most consistent with a viral upper respiratory illness.  Conservative care is recommended at this time.  This includes rest, pushing clear fluids and activity as tolerated.  You may also noticed that your appetite is reduced, this is okay as long as you are drinking plenty of clear fluids.  Acetaminophen (Tylenol): This is a good fever reducer.  If your body temperature rises above 101.5 as measured with a thermometer, it is recommended that you take 1,000 mg every 6-8 hours until your temperature falls below 101.5, please not take more than 3,000 mg of acetaminophen either as a separate medication or as in ingredient in an over-the-counter cold/flu preparation within a 24-hour period  Ibuprofen  (Advil, Motrin): This is a good anti-inflammatory medication which addresses aches and pains and, to some degree, congestion in the nasal passages.  I recommend taking between 400 to 600 mg every 6-8 hours as  needed.  You received 600 mg in the office today.  Pseudoephedrine (Sudafed): This is a decongestant.  This medication has to be purchased from the pharmacist counter, I recommend taking 2 tablets, 60 mg, 2-3 times a day as needed to relieve runny nose and sinus drainage.  Guaifenesin (Robitussin, Mucinex): This is an expectorant.  This helps break up chest congestion and loosen up thick nasal drainage making phlegm and drainage more liquid and therefore easier to remove.  I recommend taking 400 mg three times daily as needed.  Dextromethorphan (any cough medicine with the letters "DM" added to it's name such as Robitussin DM): This is a cough suppressant.  This is often recommended to be taken at nighttime to suppress cough and help people sleep.  Take as directed on the bottle.   Chloraseptic Throat Spray: Spray 5 sprays into affected area every 2 hours, hold for 15 seconds and either swallow or spit it out.  Please follow-up within the next 3 to 5 days either with your primary care provider or urgent care if your symptoms do not resolve.  If you do not have a primary care provider, we will assist you in finding one.

## 2020-11-22 NOTE — ED Provider Notes (Signed)
[facility]    CSN: [id number] Arrival date & time: 11/22/20  0802      History   Chief Complaint No chief complaint on file.   HPI Briana Shepherd is a 30 y.o. female.   Patient returns for follow-up after being seen in urgent care 2 days ago, patient was diagnosed with bacterial pharyngitis and prescribed cefdinir 30 mg to take twice daily.  Patient states she is not feeling better.  Patient states she picked up the cefdinir began taking it same day and has had no relief.  Patient that she is having worsening sore throat, now having headache that is not responding to Tylenol.  Patient states she is continue to have a fever.  Today she is 101 on arrival with a heart rate of 107.  Patient denies nausea, vomiting, diarrhea, dizziness, visual disturbance, loss of taste or smell.  The history is provided by the patient.   History reviewed. No pertinent past medical history.  Patient Active Problem List   Diagnosis Date Noted   Breast lump on right side at 2 o'clock position 12/31/2019   Encounter for removal and reinsertion of IUD 11/22/2019   IUD (intrauterine device) in place 03/18/2017    Past Surgical History:  Procedure Laterality Date   BREAST BIOPSY Right 05/16/2020   [place] BX, Q-clip, path pending    NO PAST SURGERIES      OB History     Gravida  0   Para  0   Term  0   Preterm  0   AB  0   Living  0      SAB  0   IAB  0   Ectopic  0   Multiple  0   Live Births  0            Home Medications    Prior to Admission medications   Medication Sig Start Date End Date Taking? Authorizing Provider  Levonorgestrel 13.5 MG IUD by Intrauterine route. Inserted 11/22/2019 by JML.    [provider]  Vitamin D, Ergocalciferol, (DRISDOL) 1.25 MG (50000 UNIT) CAPS capsule Take 1 capsule (50,000 Units total) by mouth every 7 (seven) days. 06/12/20   Lawhorn, Lara Mulch, CNM    Family History Family History  Problem Relation Age  of Onset   Diabetes Maternal Grandmother    Stroke Maternal Grandmother    HIV/AIDS Mother    Breast cancer Neg Hx    Ovarian cancer Neg Hx    Colon cancer Neg Hx     Social History Social History   Tobacco Use   Smoking status: Never   Smokeless tobacco: Never  Vaping Use   Vaping Use: Never used  Substance Use Topics   Alcohol use: Yes    Comment: occasional    Drug use: No     Allergies   Patient has no known allergies.   Review of Systems Review of Systems Pertinent findings noted in history of present illness.    Physical Exam Triage Vital Signs ED Triage Vitals  Enc Vitals Group     BP 11/10/20 0827 (!) 147/82     Pulse Rate 11/10/20 0827 72     Resp 11/10/20 0827 18     Temp 11/10/20 0827 98.3 F (36.8 C)     Temp Source 11/10/20 0827 Oral     SpO2 11/10/20 0827 98 %     Weight --      Height --  Head Circumference --      Peak Flow --      Pain Score 11/10/20 0826 5     Pain Loc --      Pain Edu? --      Excl. in [place]? --    No data found.  Updated Vital Signs BP 110/72 (BP Location: Left Arm)   Pulse (!) 107   Temp (!) 101 F (38.3 C) (Oral)   Resp 18   SpO2 97%   Visual Acuity Right Eye Distance:   Left Eye Distance:   Bilateral Distance:    Right Eye Near:   Left Eye Near:    Bilateral Near:     Physical Exam Vitals and nursing note reviewed.  Constitutional:      General: She is not in acute distress.    Appearance: Normal appearance. She is not ill-appearing.  HENT:     Head: Normocephalic and atraumatic.     Salivary Glands: Right salivary gland is not diffusely enlarged or tender. Left salivary gland is not diffusely enlarged or tender.     Right Ear: External ear normal. No drainage or tenderness. A middle ear effusion is present. There is no impacted cerumen. Tympanic membrane is bulging. Tympanic membrane is not erythematous.     Left Ear: External ear normal. No drainage or tenderness. A middle ear effusion is  present. There is no impacted cerumen. Tympanic membrane is bulging. Tympanic membrane is not erythematous.     Ears:     Comments: Both EACs diffusely erythematous, both TMs bulging with serous fluid    Nose: Nose normal. No nasal deformity, septal deviation, mucosal edema, congestion or rhinorrhea.     Right Turbinates: Not enlarged, swollen or pale.     Left Turbinates: Not enlarged, swollen or pale.     Right Sinus: No maxillary sinus tenderness or frontal sinus tenderness.     Left Sinus: No maxillary sinus tenderness or frontal sinus tenderness.     Mouth/Throat:     Lips: Pink. No lesions.     Mouth: Mucous membranes are moist. No oral lesions.     Pharynx: Uvula midline. Pharyngeal swelling, posterior oropharyngeal erythema and uvula swelling present.     Tonsils: Tonsillar exudate present. No tonsillar abscesses. 3+ on the right. 3+ on the left.  Eyes:     General: Lids are normal.        Right eye: No discharge.        Left eye: No discharge.     Extraocular Movements: Extraocular movements intact.     Right eye: Normal extraocular motion.     Left eye: Normal extraocular motion.     Conjunctiva/sclera: Conjunctivae normal.     Right eye: Right conjunctiva is not injected.     Left eye: Left conjunctiva is not injected.     Pupils: Pupils are equal, round, and reactive to light.  Neck:     Trachea: Trachea and phonation normal.  Cardiovascular:     Rate and Rhythm: Normal rate and regular rhythm.     Pulses: Normal pulses.     Heart sounds: Normal heart sounds. No murmur heard.   No friction rub. No gallop.  Pulmonary:     Effort: Pulmonary effort is normal. No accessory muscle usage, prolonged expiration or respiratory distress.     Breath sounds: Normal breath sounds. No stridor, decreased air movement or transmitted upper airway sounds. No decreased breath sounds, wheezing, rhonchi or rales.  Chest:  Chest wall: No tenderness.  Abdominal:     Tenderness: There is  generalized abdominal tenderness.  Musculoskeletal:        General: Normal range of motion.     Cervical back: Normal range of motion and neck supple. Normal range of motion.  Lymphadenopathy:     Cervical: No cervical adenopathy.  Skin:    General: Skin is warm and dry.     Findings: No erythema or rash.  Neurological:     General: No focal deficit present.     Mental Status: She is alert and oriented to person, place, and time.  Psychiatric:        Mood and Affect: Mood normal.        Behavior: Behavior normal.     UC Treatments / Results  Labs (all labs ordered are listed, but only abnormal results are displayed) Labs Reviewed  COVID-19, FLU A+B AND RSV    EKG   Radiology No results found.  Procedures Procedures (including critical care time)  Medications Ordered in UC Medications  ibuprofen (ADVIL) tablet 600 mg (600 mg Oral Given 11/22/20 0851)    Initial Impression / Assessment and Plan / UC Course  I have reviewed the triage vital signs and the nursing notes.  Pertinent labs & imaging results that were available during my care of the patient were reviewed by me and considered in my medical decision making (see chart for details).     Physical exam today revealed no improvement of findings compared to 2 days ago.  Patient was advised that if cefdinir has not made any difference, she can discontinue as her infections more likely viral in etiology.  Patient was tested for COVID, influenza and RSV and we will notify her of the results once received.  Conservative care recommended.  Patient verbalized understanding and agreement of plan as discussed.  All questions were addressed during visit.  Please see discharge instructions below for further details of plan.  Final Clinical Impressions(s) / UC Diagnoses   Final diagnoses:  Exposure to respiratory syncytial virus (RSV)  Acute pharyngitis, unspecified etiology  Fever, unspecified  Viral upper respiratory  infection     Discharge Instructions      I am very sorry that the cefdinir was not the magic bullet to helping you feel better.  Unfortunately, this means that your infection is viral and not bacterial so you can stop the cefdinir now.  You were tested today for:  COVID Influenza RSV  Please continue to isolate at home until you receive the results of his/her/your tests.  The results of viral testing will be made available through your MyChart account as well as by phone, this usually takes 24 to 48 hours.  Keep in mind that it is recommend that he/she/you should self isolate for at least 3 days after your symptoms began AND he/she/you is/are fever free and symptom free without the use of fever reducing medications such as Tylenol and ibuprofen.  Also keep in mind is also important to self isolate within your household and to wear a mask around other people at home on days 4 through 10 after your symptoms began.  For more information, please read to the [facility] website regarding COVID, influenza and RSV isolation guidelines.    Your symptoms are most consistent with a viral upper respiratory illness.  Conservative care is recommended at this time.  This includes rest, pushing clear fluids and activity as tolerated.  You may also noticed that your  appetite is reduced, this is okay as long as you are drinking plenty of clear fluids.  Acetaminophen (Tylenol): This is a good fever reducer.  If your body temperature rises above 101.5 as measured with a thermometer, it is recommended that you take 1,000 mg every 6-8 hours until your temperature falls below 101.5, please not take more than 3,000 mg of acetaminophen either as a separate medication or as in ingredient in an over-the-counter cold/flu preparation within a 24-hour period  Ibuprofen  (Advil, Motrin): This is a good anti-inflammatory medication which addresses aches and pains and, to some degree, congestion in the nasal passages.  I recommend  taking between 400 to 600 mg every 6-8 hours as needed.  You received 600 mg in the office today.  Pseudoephedrine (Sudafed): This is a decongestant.  This medication has to be purchased from the pharmacist counter, I recommend taking 2 tablets, 60 mg, 2-3 times a day as needed to relieve runny nose and sinus drainage.  Guaifenesin (Robitussin, Mucinex): This is an expectorant.  This helps break up chest congestion and loosen up thick nasal drainage making phlegm and drainage more liquid and therefore easier to remove.  I recommend taking 400 mg three times daily as needed.  Dextromethorphan (any cough medicine with the letters "DM" added to it's name such as Robitussin DM): This is a cough suppressant.  This is often recommended to be taken at nighttime to suppress cough and help people sleep.  Take as directed on the bottle.   Chloraseptic Throat Spray: Spray 5 sprays into affected area every 2 hours, hold for 15 seconds and either swallow or spit it out.  Please follow-up within the next 3 to 5 days either with your primary care provider or urgent care if your symptoms do not resolve.  If you do not have a primary care provider, we will assist you in finding one.      ED Prescriptions   None    PDMP not reviewed this encounter.   Disposition Upon Discharge:   The patient will follow up with their current PCP if and as advised. If the patient does not currently have a PCP we will assist them in obtaining one.   The patient may need specialty follow up if the symptoms continue, in spite of conservative treatment and management, for further workup, evaluation, consultation and treatment as clinically indicated and appropriate.  Routine symptom specific, illness specific and/or disease specific instructions were discussed with the patient and/or caregiver at length; Patient presented with an acute illness with associated systemic symptoms and significant discomfort requiring acute urgent  management. In my opinion, this is a condition that a prudent lay person (someone who possesses an average knowledge of health and medicine) may potentially expect to result in serious jeopardy, cause serious impairment of bodily function or result in serious dysfunction of bodily organs. As such, the patient has been evaluated and assessed; workup and treatment have begun; but the patient may require follow up for further testing and treatment if the symptoms continue in spite of treatment, as clinically indicated and appropriate.  If patient was tested for COVID-19, Influenza and/or RSV, the patient/parent/guardian was advised to isolate at home pending the results of his/her diagnostic coronavirus test and potentially longer if they're positive. Advised pt that if his/her COVID-19 test returns positive, it's recommended to self-isolate for at least 10 days after symptoms first appeared AND until fever-free for 24 hours without fever reducer AND other symptoms have improved or  resolved. Discussed self-isolation recommendations as well as instructions for household member/close contacts as per the [facility] and [place] DHHS, and also gave patient the [place] packet with this information.  Return to the [facility] or PCP in 3-5 days if no better; to PCP or the Emergency Department if new signs and symptoms develop, or if the current signs or symptoms continue to change or worsen for further workup, evaluation and treatment as clinically indicated and appropriate  Condition: stable for discharge home Home: take medications as prescribed; routine discharge instructions as discussed; follow up as advised.    Lynden Oxford Scales, PA-C 11/22/20 1220

## 2020-11-22 NOTE — ED Triage Notes (Signed)
Pt reports having headache and sore throat that started this past weekend. She states she is not feeling better.

## 2020-11-23 LAB — CULTURE, GROUP A STREP (THRC)

## 2020-11-23 LAB — COVID-19, FLU A+B AND RSV
Influenza A, NAA: NOT DETECTED
Influenza B, NAA: NOT DETECTED
RSV, NAA: NOT DETECTED
SARS-CoV-2, NAA: NOT DETECTED

## 2020-11-27 ENCOUNTER — Ambulatory Visit: Payer: BC Managed Care – PPO | Admitting: Family Medicine

## 2020-11-27 ENCOUNTER — Encounter: Payer: Self-pay | Admitting: Family Medicine

## 2020-11-27 ENCOUNTER — Other Ambulatory Visit: Payer: Self-pay

## 2020-11-27 VITALS — BP 90/58 | HR 97 | Resp 16 | Ht 65.0 in | Wt 178.0 lb

## 2020-11-27 DIAGNOSIS — J029 Acute pharyngitis, unspecified: Secondary | ICD-10-CM

## 2020-11-27 DIAGNOSIS — E860 Dehydration: Secondary | ICD-10-CM

## 2020-11-27 MED ORDER — NYSTATIN 100000 UNIT/ML MT SUSP: 5.0000 mL | Freq: Four times a day (QID) | OROMUCOSAL | 0 refills | Status: DC

## 2020-11-27 MED ORDER — BENZONATATE 100 MG PO CAPS
100.0000 mg | ORAL_CAPSULE | Freq: Three times a day (TID) | ORAL | 0 refills | Status: DC
Start: 2020-11-27 — End: 2021-01-31

## 2020-11-27 NOTE — Progress Notes (Signed)
Date:  11/27/2020   Name:  Briana Shepherd   DOB:  [birth year]   MRN:  [id number]   Chief Complaint: Sore Throat (She has been evaluated twice in 1 week @ UC. Symptoms started on Nov. 5. She has had diarrhea, chills, cough, congestion, headaches, sore throat and fever. She continues to have diarrhea and fever on and off and sore throat has been constant. She has been tested for COVID, Strep and flu and all results were negative. She was treated with Cefdinir and symptoms worsened after 3 days of being on it. She was told to stop medication and let it run it course.)  Sore Throat  This is a new problem. The current episode started in the past 7 days. The problem has been waxing and waning. Neither side of throat is experiencing more pain than the other. There has been no fever. The pain is at a severity of 7/10. The pain is moderate. Associated symptoms include congestion, coughing, diarrhea, ear pain, a hoarse voice and swollen glands. Pertinent negatives include no abdominal pain, drooling, ear discharge, headaches, plugged ear sensation, neck pain, shortness of breath, stridor, trouble swallowing or vomiting. She has had no exposure to strep or mono. She has tried acetaminophen for the symptoms. The treatment provided mild relief.   Lab Results  Component Value Date   CREATININE 0.98 01/04/2019   BUN 10 01/04/2019   NA 140 01/04/2019   K 4.4 01/04/2019   CL 102 01/04/2019   CO2 23 01/04/2019   Lab Results  Component Value Date   CHOL 198 01/10/2017   HDL 59 01/10/2017   LDLCALC 119 (H) 01/10/2017   TRIG 98 01/10/2017   CHOLHDL 3.4 01/10/2017   No results found for: TSH No results found for: HGBA1C Lab Results  Component Value Date   WBC 5.0 01/04/2019   HGB 12.6 01/04/2019   HCT 39.1 01/04/2019   MCV 84 01/04/2019   PLT 317 01/04/2019   Lab Results  Component Value Date   ALT 140 (H) 01/04/2019   AST 79 (H) 01/04/2019   ALKPHOS 95 01/04/2019   BILITOT 0.5 01/04/2019      Review of Systems  Constitutional:  Negative for chills and fever.  HENT:  Positive for congestion, ear pain and hoarse voice. Negative for drooling, ear discharge, hearing loss, sore throat and trouble swallowing.   Respiratory:  Positive for cough. Negative for shortness of breath, wheezing and stridor.   Cardiovascular:  Negative for chest pain, palpitations and leg swelling.  Gastrointestinal:  Positive for diarrhea. Negative for abdominal pain, blood in stool, constipation, nausea and vomiting.  Endocrine: Negative for polydipsia.  Genitourinary:  Negative for dysuria, frequency, hematuria and urgency.  Musculoskeletal:  Negative for back pain, myalgias and neck pain.  Skin:  Negative for rash.  Allergic/Immunologic: Negative for environmental allergies.  Neurological:  Negative for dizziness and headaches.  Hematological:  Does not bruise/bleed easily.  Psychiatric/Behavioral:  Negative for suicidal ideas. The patient is not nervous/anxious.    Patient Active Problem List   Diagnosis Date Noted   Breast lump on right side at 2 o'clock position 12/31/2019   Encounter for removal and reinsertion of IUD 11/22/2019   IUD (intrauterine device) in place 03/18/2017    No Known Allergies  Past Surgical History:  Procedure Laterality Date   BREAST BIOPSY Right 05/16/2020   [place] BX, Q-clip, path pending    NO PAST SURGERIES      Social History   Tobacco  Use   Smoking status: Never   Smokeless tobacco: Never  Vaping Use   Vaping Use: Never used  Substance Use Topics   Alcohol use: Yes    Comment: occasional    Drug use: No     Medication list has been reviewed and updated.  Current Meds  Medication Sig   Levonorgestrel 13.5 MG IUD by Intrauterine route. Inserted 11/22/2019 by JML.    PHQ 2/9 Scores 11/27/2020 03/31/2018  PHQ - 2 Score 0 0  PHQ- 9 Score 5 2    GAD 7 : Generalized Anxiety Score 11/27/2020  Nervous, Anxious, on Edge 1  Control/stop worrying 0   Worry too much - different things 0  Trouble relaxing 0  Restless 0  Easily annoyed or irritable 0  Afraid - awful might happen 0  Total GAD 7 Score 1    BP Readings from Last 3 Encounters:  11/27/20 (!) 90/58  11/22/20 110/72  11/20/20 121/81    Physical Exam Vitals and nursing note reviewed.  Constitutional:      General: She is not in acute distress.    Appearance: She is not diaphoretic.  HENT:     Head: Normocephalic and atraumatic.     Right Ear: External ear normal. A middle ear effusion is present.     Left Ear: External ear normal. A middle ear effusion is present.     Nose: Nose normal.     Right Turbinates: Swollen.     Left Turbinates: Swollen.     Mouth/Throat:     Lips: Pink.     Mouth: Mucous membranes are dry.     Comments: Coating tongue Eyes:     General:        Right eye: No discharge.        Left eye: No discharge.     Conjunctiva/sclera: Conjunctivae normal.     Pupils: Pupils are equal, round, and reactive to light.  Neck:     Thyroid: No thyroid mass, thyromegaly or thyroid tenderness.     Vascular: No JVD.  Cardiovascular:     Rate and Rhythm: Normal rate and regular rhythm.     Heart sounds: Normal heart sounds. No murmur heard.   No friction rub. No gallop.  Pulmonary:     Effort: Pulmonary effort is normal.     Breath sounds: Normal breath sounds.  Abdominal:     General: Bowel sounds are normal.     Palpations: Abdomen is soft. There is no hepatomegaly, splenomegaly or mass.     Tenderness: There is no abdominal tenderness. There is no guarding.  Musculoskeletal:        General: Normal range of motion.     Cervical back: Normal range of motion and neck supple.  Lymphadenopathy:     Cervical: Cervical adenopathy present.     Right cervical: Superficial cervical adenopathy present. No deep or posterior cervical adenopathy.    Left cervical: Superficial cervical adenopathy present. No deep or posterior cervical adenopathy.  Skin:     General: Skin is warm and dry.  Neurological:     Mental Status: She is alert.     Deep Tendon Reflexes: Reflexes are normal and symmetric.    Wt Readings from Last 3 Encounters:  11/27/20 178 lb (80.7 kg)  12/31/19 178 lb 14.4 oz (81.1 kg)  11/22/19 175 lb 1.6 oz (79.4 kg)    BP (!) 90/58   Pulse 97   Resp 16   Ht 5\' 5"  (  1.651 m)   Wt 178 lb (80.7 kg)   SpO2 99%   BMI 29.62 kg/m   Assessment and Plan:  1. Pharyngitis, unspecified etiology New onset.  Persistent.  Patient's had several days of antibiotics partially treated with antibiotic until she developed diarrhea.  Exam is consistent with more of a viral pharyngitis we will treat with Tessalon Perles some of the throat as well as decreased cough we will check a Monospot to rule out Epstein-Barr viral illness.  Patient also has a coating on the tongue which is consistent with oral candidiasis and we will treat with Mycostatin oral suspension 1 teaspoon swish and swallow every 6 hours. - Monospot - benzonatate (TESSALON PERLES) 100 MG capsule; Take 1 capsule (100 mg total) by mouth 3 (three) times daily.  Dispense: 20 capsule; Refill: 0 - nystatin (MYCOSTATIN) 100000 UNIT/ML suspension; Take 5 mLs (500,000 Units total) by mouth 4 (four) times daily.  Dispense: 60 mL; Refill: 0  2. Dehydration Patient has decreased mucous membranes hydration and this is consistent with mild dehydration.  Patient's been encouraged to push the fluids and if unable to maintain neck step would be to ambulatory setting that she may have to receive hydration via intravenous means.

## 2020-11-28 LAB — MONONUCLEOSIS SCREEN: Mono Screen: NEGATIVE

## 2021-01-04 ENCOUNTER — Encounter: Payer: BC Managed Care – PPO | Admitting: Certified Nurse Midwife

## 2021-01-31 ENCOUNTER — Other Ambulatory Visit: Payer: Self-pay

## 2021-01-31 ENCOUNTER — Ambulatory Visit (INDEPENDENT_AMBULATORY_CARE_PROVIDER_SITE_OTHER): Payer: BC Managed Care – PPO | Admitting: Obstetrics and Gynecology

## 2021-01-31 ENCOUNTER — Other Ambulatory Visit (HOSPITAL_COMMUNITY)
Admission: RE | Admit: 2021-01-31 | Discharge: 2021-01-31 | Disposition: A | Discharge status: Home / Self Care | Payer: BC Managed Care – PPO | Source: Ambulatory Visit | Attending: Obstetrics and Gynecology | Admitting: Obstetrics and Gynecology

## 2021-01-31 ENCOUNTER — Encounter: Payer: Self-pay | Admitting: Obstetrics and Gynecology

## 2021-01-31 VITALS — BP 113/76 | HR 70 | Ht 65.0 in | Wt 180.0 lb

## 2021-01-31 DIAGNOSIS — Z975 Presence of (intrauterine) contraceptive device: Secondary | ICD-10-CM

## 2021-01-31 DIAGNOSIS — Z124 Encounter for screening for malignant neoplasm of cervix: Secondary | ICD-10-CM

## 2021-01-31 DIAGNOSIS — Z13220 Encounter for screening for lipoid disorders: Secondary | ICD-10-CM

## 2021-01-31 DIAGNOSIS — E663 Overweight: Secondary | ICD-10-CM

## 2021-01-31 DIAGNOSIS — Z01419 Encounter for gynecological examination (general) (routine) without abnormal findings: Secondary | ICD-10-CM | POA: Insufficient documentation

## 2021-01-31 DIAGNOSIS — D241 Benign neoplasm of right breast: Secondary | ICD-10-CM | POA: Diagnosis not present

## 2021-01-31 NOTE — Progress Notes (Signed)
GYNECOLOGY ANNUAL PHYSICAL EXAM PROGRESS NOTE  Subjective:    Briana Shepherd is a 31 y.o. G0P0000 female who presents for an annual exam. The patient has no complaints today. The patient is not currently sexually active. The patient participates in regular exercise: yes (uses [place]). Has the patient ever been transfused or tattooed?: no. The patient reports that there is not domestic violence in her life.    Menstrual History: Menarche age: 17 No LMP recorded. (Menstrual status: IUD).     Gynecologic History:  Contraception: IUD, Skyla inserted 11/2019 History of STI's: Denies Last Pap: 12/15/2017. Results were: normal.  Denies  h/o abnormal pap smears.    OB History  Gravida Para Term Preterm AB Living  0 0 0 0 0 0  SAB IAB Ectopic Multiple Live Births  0 0 0 0 0    History reviewed. No pertinent past medical history.  Past Surgical History:  Procedure Laterality Date   BREAST BIOPSY Right 05/16/2020   [place] BX, Q-clip, path pending    NO PAST SURGERIES      Family History  Problem Relation Age of Onset   Diabetes Maternal Grandmother    Stroke Maternal Grandmother    HIV/AIDS Mother    Breast cancer Neg Hx    Ovarian cancer Neg Hx    Colon cancer Neg Hx     Social History   Socioeconomic History   Marital status: Single    Spouse name: Not on file   Number of children: Not on file   Years of education: Not on file   Highest education level: Not on file  Occupational History   Not on file  Tobacco Use   Smoking status: Never   Smokeless tobacco: Never  Vaping Use   Vaping Use: Never used  Substance and Sexual Activity   Alcohol use: Yes    Comment: occasional    Drug use: No   Sexual activity: Yes    Birth control/protection: I.U.D.  Other Topics Concern   Not on file  Social History Narrative   Not on file   Social Determinants of Health   Financial Resource Strain: Not on file  Food Insecurity: Not on file  Transportation Needs: Not  on file  Physical Activity: Not on file  Stress: Not on file  Social Connections: Not on file  Intimate Partner Violence: Not on file    Current Outpatient Medications on File Prior to Visit  Medication Sig Dispense Refill   Levonorgestrel 13.5 MG IUD by Intrauterine route. Inserted 11/22/2019 by JML.     No current facility-administered medications on file prior to visit.    No Known Allergies   Review of Systems Constitutional: negative for chills, fatigue, fevers and sweats Eyes: negative for irritation, redness and visual disturbance Ears, nose, mouth, throat, and face: negative for hearing loss, nasal congestion, snoring and tinnitus Respiratory: negative for asthma, cough, sputum Cardiovascular: negative for chest pain, dyspnea, exertional chest pressure/discomfort, irregular heart beat, palpitations and syncope Gastrointestinal: negative for abdominal pain, change in bowel habits, nausea and vomiting Genitourinary: negative for abnormal menstrual periods, genital lesions, sexual problems and vaginal discharge, dysuria and urinary incontinence Integument/breast: negative for breast lump, breast tenderness and nipple discharge Hematologic/lymphatic: negative for bleeding and easy bruising Musculoskeletal:negative for back pain and muscle weakness Neurological: negative for dizziness, headaches, vertigo and weakness Endocrine: negative for diabetic symptoms including polydipsia, polyuria and skin dryness Allergic/Immunologic: negative for hay fever and urticaria      Objective:  Blood pressure 113/76, pulse 70, height 5\' 5"  (1.651 m), weight 180 lb (81.6 kg), SpO2 96 %. Body mass index is 29.95 kg/m.    General Appearance:    Alert, cooperative, no distress, appears stated age  Head:    Normocephalic, without obvious abnormality, atraumatic  Eyes:    PERRL, conjunctiva/corneas clear, EOM's intact, both eyes  Ears:    Normal external ear canals, both ears  Nose:   Nares  normal, septum midline, mucosa normal, no drainage or sinus tenderness  Throat:   Lips, mucosa, and tongue normal; teeth and gums normal  Neck:   Supple, symmetrical, trachea midline, no adenopathy; thyroid: no enlargement/tenderness/nodules; no carotid bruit or JVD  Back:     Symmetric, no curvature, ROM normal, no CVA tenderness  Lungs:     Clear to auscultation bilaterally, respirations unlabored  Chest Wall:    No tenderness or deformity   Heart:    Regular rate and rhythm, S1 and S2 normal, no murmur, rub or gallop  Breast Exam:    No tenderness, masses, or nipple abnormality  Abdomen:     Soft, non-tender, bowel sounds active all four quadrants, no masses, no organomegaly.    Genitalia:    Pelvic:external genitalia normal, vagina without lesions, discharge, or tenderness, rectovaginal septum  normal. Cervix normal in appearance, no cervical motion tenderness, no adnexal masses or tenderness.  Uterus normal size, shape, mobile, regular contours, nontender.  Rectal:    Normal external sphincter.  No hemorrhoids appreciated. Internal exam not done.   Extremities:   Extremities normal, atraumatic, no cyanosis or edema  Pulses:   2+ and symmetric all extremities  Skin:   Skin color, texture, turgor normal, no rashes or lesions  Lymph nodes:   Cervical, supraclavicular, and axillary nodes normal  Neurologic:   CNII-XII intact, normal strength, sensation and reflexes throughout   .  Labs:  Lab Results  Component Value Date   WBC 5.0 01/04/2019   HGB 12.6 01/04/2019   HCT 39.1 01/04/2019   MCV 84 01/04/2019   PLT 317 01/04/2019    Lab Results  Component Value Date   CREATININE 0.98 01/04/2019   BUN 10 01/04/2019   NA 140 01/04/2019   K 4.4 01/04/2019   CL 102 01/04/2019   CO2 23 01/04/2019    Lab Results  Component Value Date   ALT 140 (H) 01/04/2019   AST 79 (H) 01/04/2019   ALKPHOS 95 01/04/2019   BILITOT 0.5 01/04/2019    No results found for: TSH   Assessment:    1. Well woman exam with routine gynecological exam   2. Screening for lipid disorders   3. Fibroadenoma, right   4. IUD (intrauterine device) in place   5. Cervical cancer screening   6. Overweight (BMI 25.0-29.9)      Plan:  - Blood tests: CBC with diff, Comprehensive metabolic panel, Lipoproteins, and TSH. - Breast self exam technique reviewed and patient encouraged to perform self-exam monthly. - Contraception: IUD. Due for removal in 2024.  - Discussed healthy lifestyle modifications. - Mammogram discussed, no further screenings until age 1 unless experiencing further issues with her fibroadenoma.  - Pap smear ordered. - COVID vaccination status: Has completed Pfizer series and first booster.  - Flu vaccine: Delcines.  - Follow up in 1 year for annual exam   Rubie Maid, MD [facility]

## 2021-02-01 LAB — COMPREHENSIVE METABOLIC PANEL
ALT: 10 IU/L (ref 0–32)
AST: 15 IU/L (ref 0–40)
Albumin/Globulin Ratio: 1.3 (ref 1.2–2.2)
Albumin: 4.4 g/dL (ref 3.8–4.8)
Alkaline Phosphatase: 101 IU/L (ref 44–121)
BUN/Creatinine Ratio: 10 (ref 9–23)
BUN: 10 mg/dL (ref 6–20)
Bilirubin Total: 0.2 mg/dL (ref 0.0–1.2)
CO2: 25 mmol/L (ref 20–29)
Calcium: 9.3 mg/dL (ref 8.7–10.2)
Chloride: 98 mmol/L (ref 96–106)
Creatinine, Ser: 0.98 mg/dL (ref 0.57–1.00)
Globulin, Total: 3.3 g/dL (ref 1.5–4.5)
Glucose: 86 mg/dL (ref 70–99)
Potassium: 4.1 mmol/L (ref 3.5–5.2)
Sodium: 136 mmol/L (ref 134–144)
Total Protein: 7.7 g/dL (ref 6.0–8.5)
eGFR: 79 mL/min/{1.73_m2} (ref >59)

## 2021-02-01 LAB — LIPID PANEL
Chol/HDL Ratio: 3.8 ratio (ref 0.0–4.4)
Cholesterol, Total: 193 mg/dL (ref 100–199)
HDL: 51 mg/dL (ref >39)
LDL Chol Calc (NIH): 122 mg/dL — ABNORMAL HIGH (ref 0–99)
Triglycerides: 109 mg/dL (ref 0–149)
VLDL Cholesterol Cal: 20 mg/dL (ref 5–40)

## 2021-02-01 LAB — TSH: TSH: 2.04 u[IU]/mL (ref 0.450–4.500)

## 2021-02-01 LAB — CBC
Hematocrit: 36.6 % (ref 34.0–46.6)
Hemoglobin: 12 g/dL (ref 11.1–15.9)
MCH: 27.3 pg (ref 26.6–33.0)
MCHC: 32.8 g/dL (ref 31.5–35.7)
MCV: 83 fL (ref 79–97)
Platelets: 332 10*3/uL (ref 150–450)
RBC: 4.39 x10E6/uL (ref 3.77–5.28)
RDW: 12.4 % (ref 11.7–15.4)
WBC: 4.4 10*3/uL (ref 3.4–10.8)

## 2021-02-07 ENCOUNTER — Encounter: Payer: Self-pay | Admitting: Obstetrics and Gynecology

## 2021-02-07 LAB — CYTOLOGY - PAP
Comment: NEGATIVE
Comment: NEGATIVE
Diagnosis: NEGATIVE
HPV 16: NEGATIVE
HPV 18 / 45: NEGATIVE
High risk HPV: POSITIVE — AB

## 2021-02-13 ENCOUNTER — Ambulatory Visit: Payer: Self-pay | Admitting: Nurse Practitioner

## 2021-05-21 ENCOUNTER — Encounter: Payer: Self-pay | Admitting: Obstetrics and Gynecology

## 2021-05-22 ENCOUNTER — Telehealth: Payer: Self-pay

## 2021-05-22 NOTE — Telephone Encounter (Signed)
Notes scanned to referral 

## 2021-06-14 NOTE — Progress Notes (Signed)
    GYNECOLOGY PROGRESS NOTE  Subjective:    Patient ID: Briana Shepherd, female    DOB: [birth year], 31 y.o.   MRN: [id number]  HPI  Patient is a 31 y.o. G0P0000 female who presents for follow up on exposure to STD. 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 She has no pain or drainage. Does note intermittent itching around the anal area as well x 1 month. Denies pain.   The following portions of the patient's history were reviewed and updated as appropriate: allergies, current medications, past family history, past medical history, past social history, past surgical history, and problem list.  Review of Systems Pertinent items are noted in HPI.   Objective:   Blood pressure 111/72, pulse 70, resp. rate 16, height '5\' 5"'$  (1.651 m), weight 186 lb (84.4 kg).  Body mass index is 30.95 kg/m. General appearance: alert, cooperative, and no distress Abdomen: soft, non-tender; bowel sounds normal; no masses,  no organomegaly Pelvic: external genitalia normal, rectovaginal septum normal.  Small sebaceous cyst noted on the right labia minora and left labia minora with possible healing folliculitis (recent hair removal noted).  Minuscule genital wart noted at posterior fourchette.  Rectal area appears normal without lesion. Extremities: extremities normal, atraumatic, no cyanosis or edema Neurologic: Grossly normal   Assessment:   1. Screen for STD (sexually transmitted disease)   2. Genital warts   3. Sebaceous cyst of labia      Plan:  Patient given reassurance on findings.  Advised that at this time it does not appear that she has an STD (patient most concerned about possible HSV outbreak).  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  I discussed concerns with patient.  Also offered full panel STD screening for reassurance including HSV  testing.  Patient okay to test today.  Will notify results by MyChart.   Rubie Maid, MD [facility]

## 2021-06-19 ENCOUNTER — Ambulatory Visit: Payer: BC Managed Care – PPO | Admitting: Obstetrics and Gynecology

## 2021-06-19 ENCOUNTER — Encounter: Payer: Self-pay | Admitting: Obstetrics and Gynecology

## 2021-06-19 ENCOUNTER — Other Ambulatory Visit (HOSPITAL_COMMUNITY)
Admission: RE | Admit: 2021-06-19 | Discharge: 2021-06-19 | Disposition: A | Discharge status: Home / Self Care | Payer: BC Managed Care – PPO | Source: Ambulatory Visit | Attending: Obstetrics and Gynecology | Admitting: Obstetrics and Gynecology

## 2021-06-19 VITALS — BP 111/72 | HR 70 | Resp 16 | Ht 65.0 in | Wt 186.0 lb

## 2021-06-19 DIAGNOSIS — N907 Vulvar cyst: Secondary | ICD-10-CM

## 2021-06-19 DIAGNOSIS — Z113 Encounter for screening for infections with a predominantly sexual mode of transmission: Secondary | ICD-10-CM | POA: Diagnosis present

## 2021-06-19 DIAGNOSIS — A630 Anogenital (venereal) warts: Secondary | ICD-10-CM

## 2021-06-20 LAB — CERVICOVAGINAL ANCILLARY ONLY
Chlamydia: NEGATIVE
Comment: NEGATIVE
Comment: NEGATIVE
Comment: NORMAL
Neisseria Gonorrhea: NEGATIVE
Trichomonas: NEGATIVE

## 2021-06-20 LAB — HSV(HERPES SIMPLEX VRS) I + II AB-IGG
HSV 1 Glycoprotein G Ab, IgG: <0.91 index (ref 0.00–0.90)
HSV 2 IgG, Type Spec: <0.91 index (ref 0.00–0.90)

## 2021-06-20 LAB — HIV ANTIBODY (ROUTINE TESTING W REFLEX): HIV Screen 4th Generation wRfx: NONREACTIVE

## 2021-06-20 LAB — RPR: RPR Ser Ql: NONREACTIVE

## 2022-10-28 IMAGING — MG MM BREAST LOCALIZATION CLIP
4 series · 4 of 12 positions shown · non-contrast
Comparison: Previous exam(s).

CLINICAL DATA: Post biopsy mammogram of the right breast for clip
placement.

EXAM:
DIAGNOSTIC RIGHT MAMMOGRAM POST ULTRASOUND BIOPSY

[R ML synth-2D]
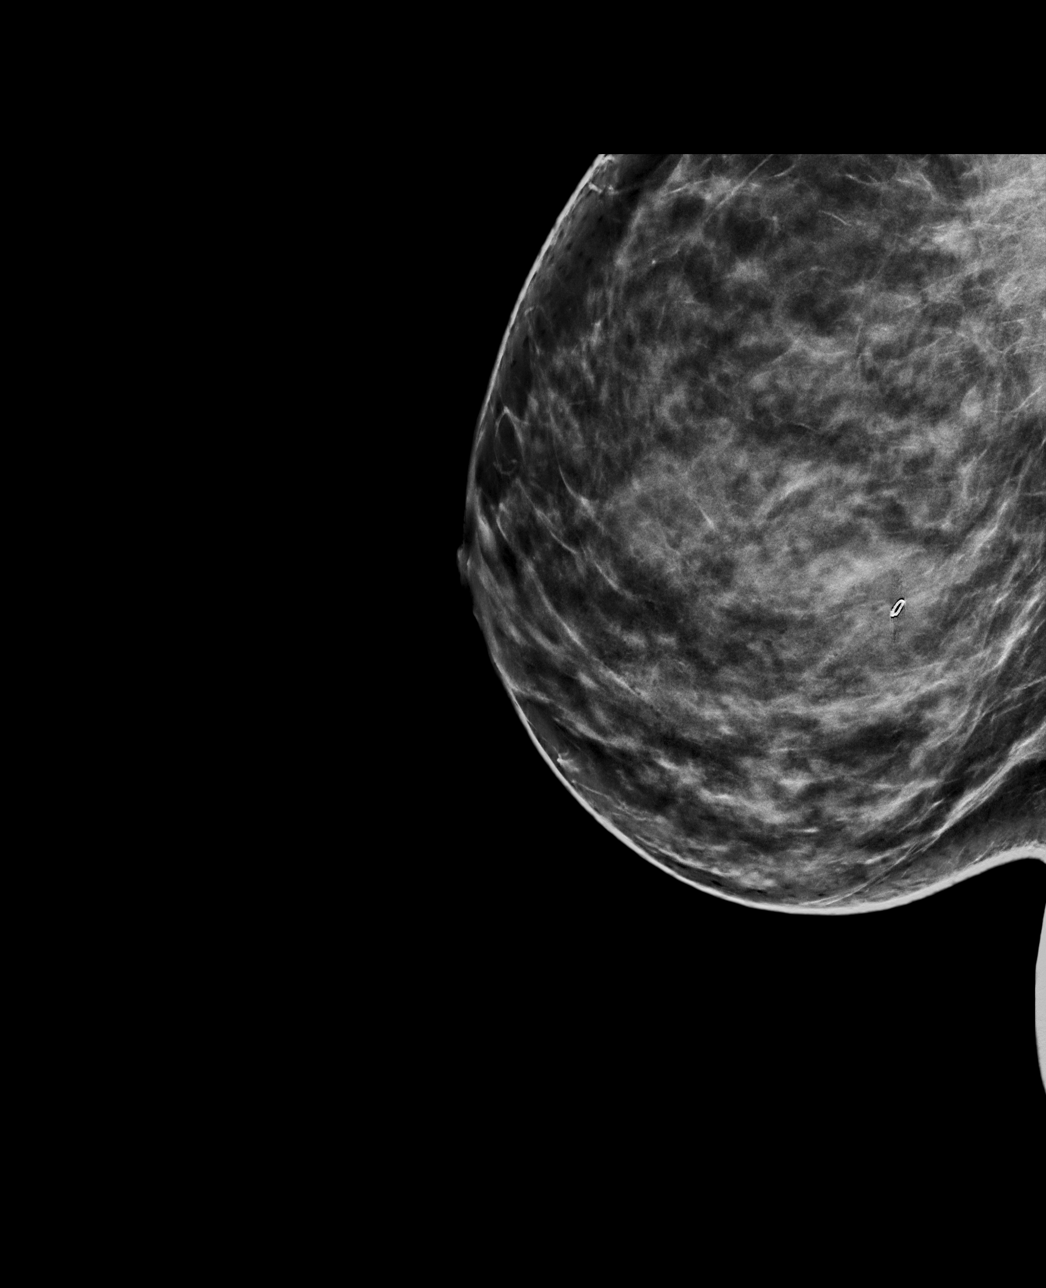

[R CC synth-2D]
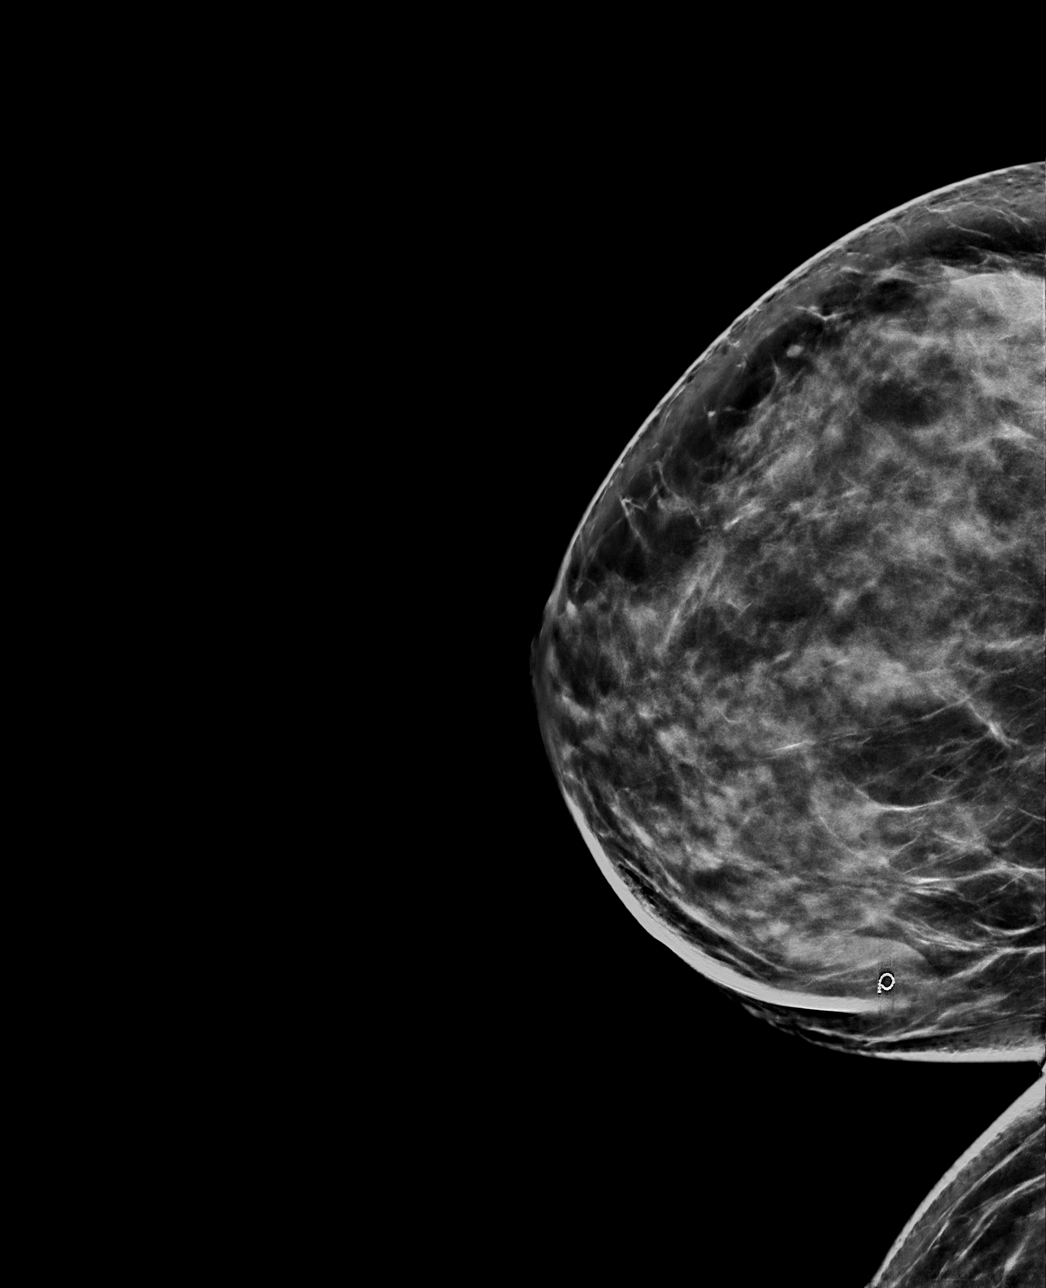

[R ML tomo · tomo slice 42/83.0]
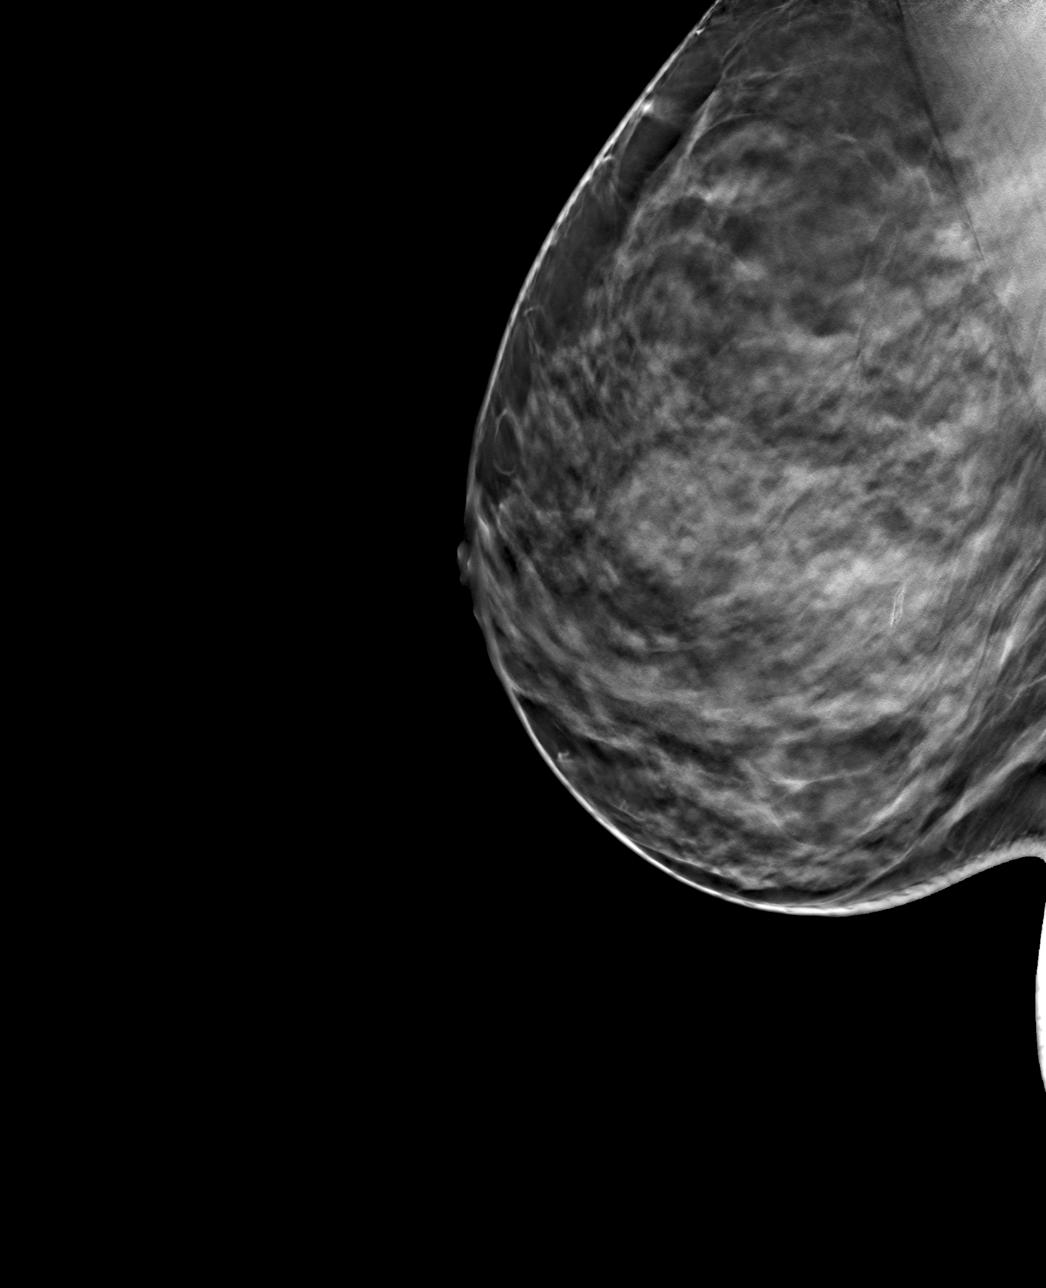

[R CC tomo · tomo slice 41/82.0]
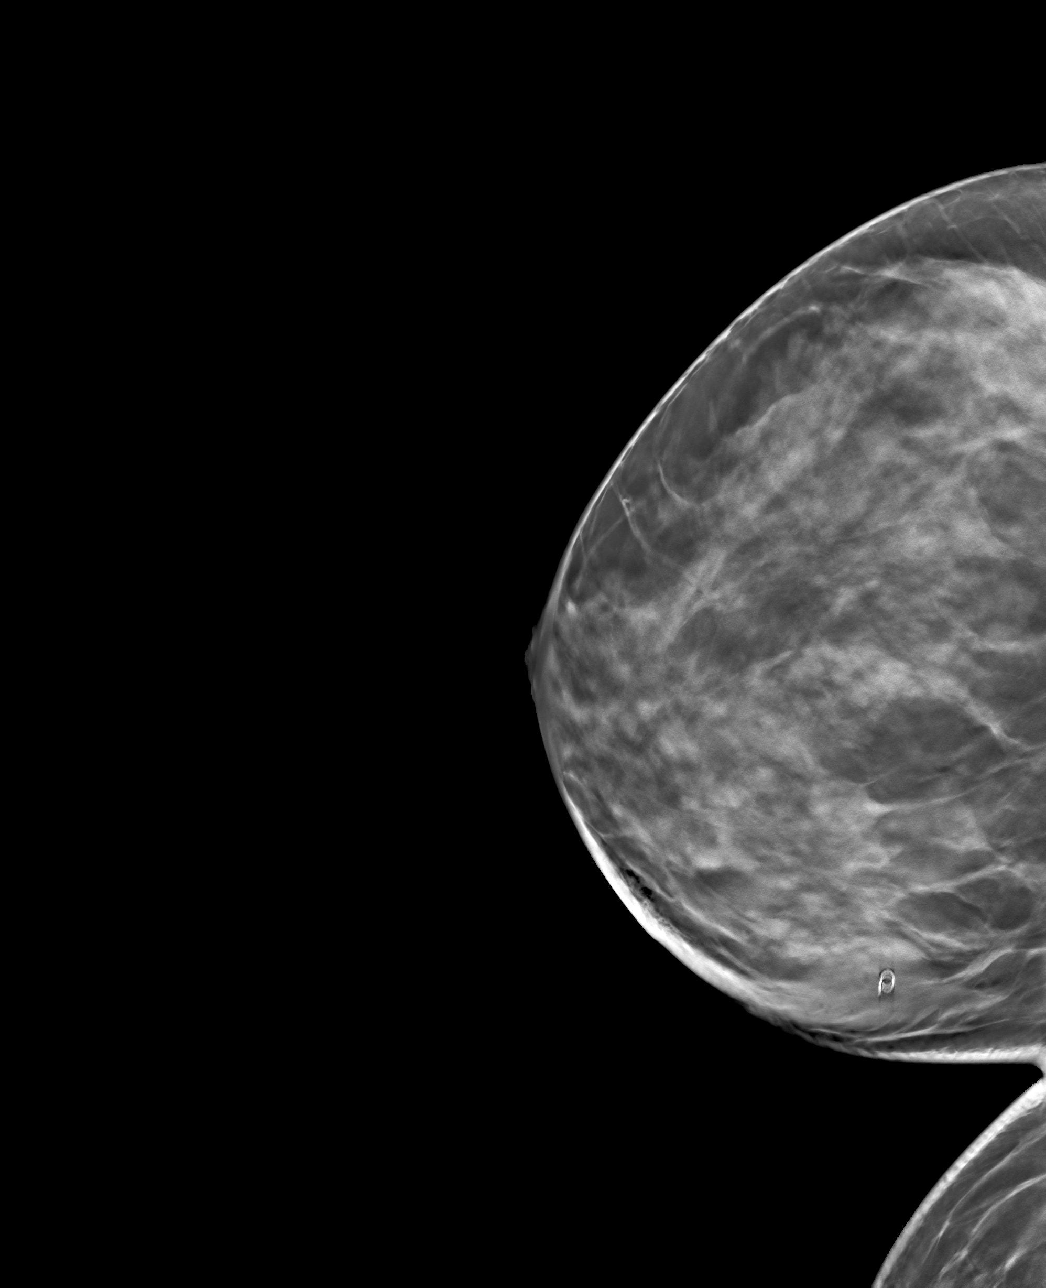

[4 of 12 positions shown; findings below may reference images not displayed]

FINDINGS: Mammographic images were obtained following ultrasound guided biopsy
of a mass in the right breast at 3 o'clock. The biopsy marking clip
is in expected position at the site of biopsy.
IMPRESSION: Appropriate positioning of the Q shaped biopsy marking clip at the
site of biopsy in the medial right breast.

Final Assessment: Post Procedure Mammograms for Marker Placement

## 2023-01-28 ENCOUNTER — Telehealth: Payer: 59 | Admitting: Physician Assistant

## 2023-01-28 DIAGNOSIS — B9689 Other specified bacterial agents as the cause of diseases classified elsewhere: Secondary | ICD-10-CM | POA: Diagnosis not present

## 2023-01-28 DIAGNOSIS — J019 Acute sinusitis, unspecified: Secondary | ICD-10-CM

## 2023-01-28 MED ORDER — AMOXICILLIN-POT CLAVULANATE 875-125 MG PO TABS: 1.0000 | ORAL_TABLET | Freq: Two times a day (BID) | ORAL | 0 refills | Status: AC

## 2023-01-28 MED ORDER — FLUTICASONE PROPIONATE 50 MCG/ACT NA SUSP: 2.0000 | Freq: Every day | NASAL | 0 refills | Status: AC

## 2023-01-28 NOTE — Patient Instructions (Signed)
 Briana Shepherd, thank you for joining Briana Velma Lunger, PA-C for today's virtual visit.  While this provider is not your primary care provider (PCP), if your PCP is located in our provider database this encounter information will be shared with them immediately following your visit.   A [facility] MyChart account gives you access to today's visit and all your visits, tests, and labs performed at [facility]  click here if you don't have a [facility] MyChart account or go to [email or web address]  Consent: (Patient) Briana Shepherd provided verbal consent for this virtual visit at the beginning of the encounter.  Current Medications:  Current Outpatient Medications:    amoxicillin -clavulanate (AUGMENTIN ) 875-125 MG tablet, Take 1 tablet by mouth 2 (two) times daily., Disp: 14 tablet, Rfl: 0   fluticasone  (FLONASE ) 50 MCG/ACT nasal spray, Place 2 sprays into both nostrils daily., Disp: 16 g, Rfl: 0   ketoconazole (NIZORAL) 2 % shampoo, Apply topically., Disp: , Rfl:    Medications ordered in this encounter:  Meds ordered this encounter  Medications   amoxicillin -clavulanate (AUGMENTIN ) 875-125 MG tablet    Sig: Take 1 tablet by mouth 2 (two) times daily.    Dispense:  14 tablet    Refill:  0    Supervising Provider:   LAMPTEY, PHILIP O [[id number]]   fluticasone  (FLONASE ) 50 MCG/ACT nasal spray    Sig: Place 2 sprays into both nostrils daily.    Dispense:  16 g    Refill:  0    Supervising Provider:   BLAISE ALEENE KIDD [[id number]]     *If you need refills on other medications prior to your next appointment, please contact your pharmacy*  Follow-Up: Call back or seek an in-person evaluation if the symptoms worsen or if the condition fails to improve as anticipated.  [facility] [phone]  Other Instructions Please take antibiotic as directed.  Increase fluid intake.  Use Saline nasal spray.  Take a daily multivitamin. Use the Flonase  as  directed. Ok to continue .  Place a humidifier in the bedroom.  Please call or return clinic if symptoms are not improving.  Sinusitis Sinusitis is redness, soreness, and swelling (inflammation) of the paranasal sinuses. Paranasal sinuses are air pockets within the bones of your face (beneath the eyes, the middle of the forehead, or above the eyes). In healthy paranasal sinuses, mucus is able to drain out, and air is able to circulate through them by way of your nose. However, when your paranasal sinuses are inflamed, mucus and air can become trapped. This can allow bacteria and other germs to grow and cause infection. Sinusitis can develop quickly and last only a short time (acute) or continue over a long period (chronic). Sinusitis that lasts for more than 12 weeks is considered chronic.  CAUSES  Causes of sinusitis include: Allergies. Structural abnormalities, such as displacement of the cartilage that separates your nostrils (deviated septum), which can decrease the air flow through your nose and sinuses and affect sinus drainage. Functional abnormalities, such as when the small hairs (cilia) that line your sinuses and help remove mucus do not work properly or are not present. SYMPTOMS  Symptoms of acute and chronic sinusitis are the same. The primary symptoms are pain and pressure around the affected sinuses. Other symptoms include: Upper toothache. Earache. Headache. Bad breath. Decreased sense of smell and taste. A cough, which worsens when you are lying flat. Fatigue. Fever. Thick drainage from your nose, which often is green and may  contain pus (purulent). Swelling and warmth over the affected sinuses. DIAGNOSIS  Your caregiver will perform a physical exam. During the exam, your caregiver may: Look in your nose for signs of abnormal growths in your nostrils (nasal polyps). Tap over the affected sinus to check for signs of infection. View the inside of your sinuses (endoscopy) with  a special imaging device with a light attached (endoscope), which is inserted into your sinuses. If your caregiver suspects that you have chronic sinusitis, one or more of the following tests may be recommended: Allergy tests. Nasal culture A sample of mucus is taken from your nose and sent to a lab and screened for bacteria. Nasal cytology A sample of mucus is taken from your nose and examined by your caregiver to determine if your sinusitis is related to an allergy. TREATMENT  Most cases of acute sinusitis are related to a viral infection and will resolve on their own within 10 days. Sometimes medicines are prescribed to help relieve symptoms (pain medicine, decongestants, nasal steroid sprays, or saline sprays).  However, for sinusitis related to a bacterial infection, your caregiver will prescribe antibiotic medicines. These are medicines that will help kill the bacteria causing the infection.  Rarely, sinusitis is caused by a fungal infection. In theses cases, your caregiver will prescribe antifungal medicine. For some cases of chronic sinusitis, surgery is needed. Generally, these are cases in which sinusitis recurs more than 3 times per year, despite other treatments. HOME CARE INSTRUCTIONS  Drink plenty of water. Water helps thin the mucus so your sinuses can drain more easily. Use a humidifier. Inhale steam 3 to 4 times a day (for example, sit in the bathroom with the shower running). Apply a warm, moist washcloth to your face 3 to 4 times a day, or as directed by your caregiver. Use saline nasal sprays to help moisten and clean your sinuses. Take over-the-counter or prescription medicines for pain, discomfort, or fever only as directed by your caregiver. SEEK IMMEDIATE MEDICAL CARE IF: You have increasing pain or severe headaches. You have nausea, vomiting, or drowsiness. You have swelling around your face. You have vision problems. You have a stiff neck. You have difficulty  breathing. MAKE SURE YOU:  Understand these instructions. Will watch your condition. Will get help right away if you are not doing well or get worse. Document Released: 12/31/2004 Document Revised: 03/25/2011 Document Reviewed: 01/15/2011 [facility] Patient Information 2014 [place], [place].    If you have been instructed to have an in-person evaluation today at a local Urgent Care facility, please use the link below. It will take you to a list of all of our available [facility], including address, phone number and hours of operation. Please do not delay care.  [facility]  If you or a family member do not have a primary care provider, use the link below to schedule a visit and establish care. When you choose a [facility] primary care physician or advanced practice provider, you gain a long-term partner in health. Find a Primary Care Provider  Learn more about [facility]'s in-office and virtual care options:  - Get Care Now

## 2023-01-28 NOTE — Progress Notes (Signed)
 Virtual Visit Consent   Briana Shepherd, you are scheduled for a virtual visit with a [facility] provider today. Just as with appointments in the office, your consent must be obtained to participate. Your consent will be active for this visit and any virtual visit you may have with one of our providers in the next 365 days. If you have a MyChart account, a copy of this consent can be sent to you electronically.  As this is a virtual visit, video technology does not allow for your provider to perform a traditional examination. This may limit your provider's ability to fully assess your condition. If your provider identifies any concerns that need to be evaluated in person or the need to arrange testing (such as labs, EKG, etc.), we will make arrangements to do so. Although advances in technology are sophisticated, we cannot ensure that it will always work on either your end or our end. If the connection with a video visit is poor, the visit may have to be switched to a telephone visit. With either a video or telephone visit, we are not always able to ensure that we have a secure connection.  By engaging in this virtual visit, you consent to the provision of healthcare and authorize for your insurance to be billed (if applicable) for the services provided during this visit. Depending on your insurance coverage, you may receive a charge related to this service.  I need to obtain your verbal consent now. Are you willing to proceed with your visit today? Briana Shepherd has provided verbal consent on 01/28/2023 for a virtual visit (video or telephone). Briana Shepherd, [place]  Date: 01/28/2023 8:23 AM  Virtual Visit via Video Note   I, Briana Shepherd, connected with  Briana Shepherd  ([id number], 1990-10-02) on 01/28/23 at  8:15 AM EST by a video-enabled telemedicine application and verified that I am speaking with the correct person using two identifiers.  Location: Patient: Virtual Visit  Location Patient: Home Provider: Virtual Visit Location Provider: Home Office   I discussed the limitations of evaluation and management by telemedicine and the availability of in person appointments. The patient expressed understanding and agreed to proceed.    History of Present Illness: Briana Shepherd is a 33 y.o. who identifies as a female who was assigned female at birth, and is being seen today for URI symptoms starting last week with nasal congestion, sinus pressure, sinus pain and headache. Notes some jaw/dental pain last night. Denies fever, chills, aches, chest congestion. COVID test negative.   HPI: HPI  Problems:  Patient Active Problem List   Diagnosis Date Noted   Breast lump on right side at 2 o'clock position 12/31/2019   Encounter for removal and reinsertion of IUD 11/22/2019   IUD (intrauterine device) in place 03/18/2017    Allergies: No Known Allergies Medications:  Current Outpatient Medications:    amoxicillin -clavulanate (AUGMENTIN ) 875-125 MG tablet, Take 1 tablet by mouth 2 (two) times daily., Disp: 14 tablet, Rfl: 0   fluticasone  (FLONASE ) 50 MCG/ACT nasal spray, Place 2 sprays into both nostrils daily., Disp: 16 g, Rfl: 0   ketoconazole (NIZORAL) 2 % shampoo, Apply topically., Disp: , Rfl:   Observations/Objective: Patient is well-developed, well-nourished in no acute distress.  Resting comfortably at home.  Head is normocephalic, atraumatic.  No labored breathing. Speech is clear and coherent with logical content.  Patient is alert and oriented at baseline.   Assessment and Plan: 1. Acute bacterial sinusitis (Primary) - amoxicillin -clavulanate (AUGMENTIN ) 875-125  MG tablet; Take 1 tablet by mouth 2 (two) times daily.  Dispense: 14 tablet; Refill: 0 - fluticasone  (FLONASE ) 50 MCG/ACT nasal spray; Place 2 sprays into both nostrils daily.  Dispense: 16 g; Refill: 0  Rx Augmentin .  Increase fluids.  Rest.  Saline nasal spray.  Probiotic.  Mucinex as  directed.  Humidifier in bedroom. Flonase  per orders.  Call or return to clinic if symptoms are not improving.   Follow Up Instructions: I discussed the assessment and treatment plan with the patient. The patient was provided an opportunity to ask questions and all were answered. The patient agreed with the plan and demonstrated an understanding of the instructions.  A copy of instructions were sent to the patient via MyChart unless otherwise noted below.   The patient was advised to call back or seek an in-person evaluation if the symptoms worsen or if the condition fails to improve as anticipated.    Briana Velma Lunger, PA-C

## 2023-10-10 ENCOUNTER — Encounter: Payer: 59 | Admitting: Family Medicine

## 2024-03-18 ENCOUNTER — Ambulatory Visit: Admitting: Nurse Practitioner
